# Patient Record
Sex: Male | Born: 1945
Health system: Southern US, Community
[De-identification: ages and names within clinical notes are randomized; demographics above are authoritative.]

## PROBLEM LIST (undated history)

## (undated) DIAGNOSIS — F32A Depression, unspecified: Secondary | ICD-10-CM

## (undated) DIAGNOSIS — I639 Cerebral infarction, unspecified: Secondary | ICD-10-CM

## (undated) DIAGNOSIS — H9190 Unspecified hearing loss, unspecified ear: Secondary | ICD-10-CM

## (undated) DIAGNOSIS — R413 Other amnesia: Secondary | ICD-10-CM

## (undated) DIAGNOSIS — F329 Major depressive disorder, single episode, unspecified: Secondary | ICD-10-CM

## (undated) DIAGNOSIS — R5383 Other fatigue: Secondary | ICD-10-CM

## (undated) DIAGNOSIS — R079 Chest pain, unspecified: Secondary | ICD-10-CM

## (undated) DIAGNOSIS — R480 Dyslexia and alexia: Secondary | ICD-10-CM

## (undated) DIAGNOSIS — F419 Anxiety disorder, unspecified: Secondary | ICD-10-CM

## (undated) DIAGNOSIS — J302 Other seasonal allergic rhinitis: Secondary | ICD-10-CM

## (undated) DIAGNOSIS — I10 Essential (primary) hypertension: Secondary | ICD-10-CM

## (undated) HISTORY — DX: Anxiety disorder, unspecified: F41.9

## (undated) HISTORY — DX: Major depressive disorder, single episode, unspecified: F32.9

## (undated) HISTORY — DX: Other amnesia: R41.3

## (undated) HISTORY — DX: Other fatigue: R53.83

## (undated) HISTORY — DX: Chest pain, unspecified: R07.9

## (undated) HISTORY — PX: TOTAL KNEE ARTHROPLASTY: SHX125

## (undated) HISTORY — DX: Essential (primary) hypertension: I10

## (undated) HISTORY — DX: Cerebral infarction, unspecified: I63.9

## (undated) HISTORY — PX: HIP ARTHROPLASTY: SHX981

## (undated) HISTORY — DX: Depression, unspecified: F32.A

## (undated) HISTORY — DX: Unspecified hearing loss, unspecified ear: H91.90

## (undated) HISTORY — DX: Other seasonal allergic rhinitis: J30.2

## (undated) HISTORY — DX: Dyslexia and alexia: R48.0

---

## 2003-06-25 ENCOUNTER — Ambulatory Visit (HOSPITAL_COMMUNITY): Admission: RE | Admit: 2003-06-25 | Discharge: 2003-06-25 | Payer: Self-pay | Admitting: Internal Medicine

## 2008-01-15 ENCOUNTER — Ambulatory Visit: Payer: Self-pay | Admitting: Cardiology

## 2008-01-15 ENCOUNTER — Encounter: Payer: Self-pay | Admitting: Cardiology

## 2008-01-16 ENCOUNTER — Observation Stay (HOSPITAL_COMMUNITY): Admission: AD | Admit: 2008-01-16 | Discharge: 2008-01-18 | Payer: Self-pay | Admitting: Cardiovascular Disease

## 2008-01-16 ENCOUNTER — Ambulatory Visit: Payer: Self-pay | Admitting: Cardiology

## 2008-01-16 ENCOUNTER — Encounter: Payer: Self-pay | Admitting: Cardiology

## 2008-01-17 ENCOUNTER — Encounter: Payer: Self-pay | Admitting: Cardiology

## 2008-02-18 ENCOUNTER — Ambulatory Visit: Payer: Self-pay | Admitting: Cardiology

## 2008-12-09 DIAGNOSIS — R079 Chest pain, unspecified: Secondary | ICD-10-CM

## 2008-12-09 DIAGNOSIS — I10 Essential (primary) hypertension: Secondary | ICD-10-CM | POA: Insufficient documentation

## 2008-12-10 ENCOUNTER — Encounter (INDEPENDENT_AMBULATORY_CARE_PROVIDER_SITE_OTHER): Payer: Self-pay | Admitting: *Deleted

## 2010-08-30 NOTE — Discharge Summary (Signed)
Chris [name], Chris [name]               ACCOUNT NO.:  [id number]   MEDICAL RECORD NO.:  [id number]          PATIENT TYPE:  INP   LOCATION:  2033                         FACILITY:  [facility]   PHYSICIAN:  Luis Abed, MD, FACCDATE OF BIRTH:  1946-01-31   DATE OF ADMISSION:  01/16/2008  DATE OF DISCHARGE:  01/18/2008                         DISCHARGE SUMMARY - REFERRING   DISCHARGING PHYSICIAN:  Luis Abed, MD.   DISCHARGE DIAGNOSES:  1. Weakness associated with atypical chest discomfort.  2. Hypertension.  3. History as noted below.   PROCEDURES PERFORMED:  Right and left cardiac catheterization on January 17, 2008 by Dr. Riley Kill.   SUMMARY OF HISTORY:  Chris [name] is a 65 year old white male who was  admitted to [facility] from his primary care physician's office  on January 15, 2008 secondary to weakness, diaphoresis,  lightheadedness associated with chest discomfort.  He ruled out for  myocardial infarction and cardiology was consulted, nonspecific ST/T  wave changes.   PAST MEDICAL HISTORY:  1. Notable for hypertension.  2. Dyslipidemia.  3. Negative stress Myoview in 2003.  4. Remote history of depression.  5. Alcohol use.   LABORATORY:  Admission weight was 84 kg.  Discharge H&H was 13.8 and  39.8, normal indices, platelets 159, WBCs 5.7, sodium 149, potassium  3.9, BUN 11, creatinine 0.88.   DIAGNOSTICS:  EKGs at [facility] showed sinus bradycardia with a rate of 49, T-  wave inversions in III and aVL, early repolarization, left axis  deviation, early R-wave.   HOSPITAL COURSE:  The patient was admitted to [facility] and  transferred to [facility] for further evaluation.  Right and left cardiac  catheterization was performed on January 17, 2008.  It showed normal  right heart pressures.  EF was 55%.  There was no significant  obstruction.  Post bedrest and ambulation, the patient was initially  unable to urinate.  Foley was temporarily placed and the  difficulty  resolved post discontinuation of the Foley.  He was ambulating in the  halls on January 18, 2008.  After review with Dr. Myrtis Ser, he felt that the  patient could be discharged home.   DISPOSITION:  Chris [name] is discharged home on January 18, 2008.   DISCHARGE INSTRUCTIONS:  Wound care and activities are specified in  regards to supplemental sheet postcatheterization.  He was specifically  advised that he should not be lifting, driving, sexual activity, camping  or back packing for at least 48 hours.  He was asked to maintain a blood  pressure diary and bring all medications and diary to all appointments.  The [facility] office will call him with an early follow up appointment per  Dr. Riley Kill with Dr. Andee Lineman.  He was also asked to her make arrangements  for follow up appointment with Dr. Dimas Aguas.   DISCHARGE MEDICATIONS:  He was discharged home on his previous  medications which include:  1. Aspirin 81 mg daily.  2. Lexapro 10 mg daily.   Discharge time 25 minutes.      Joellyn Rued, PA-C      Luis Abed, MD,  [facility]  Electronically Signed    EW/MEDQ  D:  01/18/2008  T:  01/18/2008  Job:  [id number]   cc:   Learta Codding, MD,FACC  Selinda Flavin

## 2010-08-30 NOTE — Cardiovascular Report (Signed)
NAMEMARTIN, BELLING               ACCOUNT NO.:  [id number]   MEDICAL RECORD NO.:  [id number]          PATIENT TYPE:  INP   LOCATION:  2033                         FACILITY:  [facility]   PHYSICIAN:  Arturo Morton. Riley Kill, MD, FACCDATE OF BIRTH:  09-12-1945   DATE OF PROCEDURE:  DATE OF DISCHARGE:                            CARDIAC CATHETERIZATION   INDICATIONS:  Mr. Geffre is a gentleman who presented with weakness,  diaphoresis, lightheadedness, and some chest discomfort.  He was ruled  out for myocardial infarction.  He underwent evaluation by Dr. Andee Lineman.  There was no exercise-induced chest pain or EKG change, but with  inferior perfusion defect.  There was also an echocardiogram, ejection  fraction of 55-60%.  There was mild dilatation of the left atrium, right  ventricle, and right atrium.  The right ventricular systolic pressure  was calculated at 46 mm.  He was brought to the Catheterization  Laboratory after evaluation of these findings.  I discussed the case  with Dr. Andee Lineman prior to the procedure.   PROCEDURES:  1. Left and right heart catheterization.  2. Selective coronary arteriography.  3. Selective left ventriculography.   DESCRIPTION OF PROCEDURE:  The patient was brought to the  Catheterization Laboratory and prepped and draped in the usual fashion.  Through the anterior puncture, the femoral artery was easily entered.  We used 5-French catheters.  We used an L3-L5 catheter to engage the  left coronary.  Intracoronary nitroglycerin was also given.  Given the  findings based on the echocardiographic study, I then in the light of  normal coronaries elected to recommend a right heart catheterization to  exclude to do pressures, and also to exclude a left-to-right shunt.  As  a result, we used a Smart needle to engage the right femoral vein.  A 7-  French sheath was placed.  A Swan-Ganz catheter was then placed into the  superior vena cava.  Saturations were obtained.  The  saturation was  obtained in the pulmonary artery and also in the aorta.  Right heart  pressures were sequentially performed and thermodilution cardiac outputs  were performed.  I then reviewed the findings with the patient,  subsequently with the family.  I notified Dr. Andee Lineman.  There were no  complications and he was taken to the holding area for sheath removal.   HEMODYNAMIC DATA.:  1. Aortic 128/76, mean 98.  2. Left ventricular 117/2.  3. Right atrium 4.  4. RV 23/3.  5. Pulmonary artery 17/5.  6. Pulmonary capillary wedge 7.  7. Aortic saturation 95%.  8. Superior vena cava saturation 64%.  9. PA saturation 69%.  10.Fick cardiac output 5.2 liters per minute.  11.Fick cardiac index 2.63 liters per minute per meter squared.  12.Thermodilution cardiac output 5.2 liters per minute.  13.Fick cardiac index 2.64 liters per minute per meters squared.   ANGIOGRAPHIC DATA.:  1. Ventriculography was done in the RAO projection.  Overall systolic      function was preserved.  No definite wall motion abnormalities were      seen.  There did not appear to be  significant mitral regurgitation.  2. The left main coronary was free of critical disease.  3. The LAD has just minor luminal irregularities, however, no areas of      high-grade obstruction were noted.  There were least 2 diagonal      branches of moderate size.  No critical obstruction was noted.  4. The circumflex demonstrates a large marginal branch, which goes out      posteriorly.  Multiple views were obtained to try to lay out the      overlapping takeoff of the AV circumflex in the proximal marginal.      After review of multiple views, there did not appear to be any      significant obstruction in this vessel.  At most, there was minimal      luminal irregularity at the bend and flexion point of the marginal.      However, there did not appear to be high-grade disease.  5. The right coronary artery is a large tortuous vessel,  there is PDA      and posterolateral branches.  No significant obstruction is noted.   CONCLUSION:  1. Well-preserved left ventricular function.  2. No evidence of high-grade coronary obstruction.  3. Normal right heart pressures without evidence of significant left-      to-right shunting.   DISPOSITION:  The patient will be taken to the floor and monitored.  His  enoxaparin will be discontinued.  He will likely need an outpatient  monitor.      Arturo Morton. Riley Kill, MD, [facility]  Electronically Signed     TDS/MEDQ  D:  01/17/2008  T:  01/18/2008  Job:  [id number]   cc:   Lorie Phenix, MD,FACC

## 2010-08-30 NOTE — Assessment & Plan Note (Signed)
[facility]                          [facility] OFFICE NOTE   NAME:[name], [name]                      MRN:          [id number]  DATE:02/18/2008                            DOB:          07-08-1945    PRIMARY CARE PHYSICIAN:  Dr. Selinda [name].   PRIMARY CARDIOLOGIST:  Chris Codding, MD, [facility]   REASON FOR VISIT:  Post cardiac catheterization followup.   HISTORY OF PRESENT ILLNESS:  I am seeing Chris [name] today for Dr.  Andee [name].  He is a pleasant 65 year old male referred recently to [facility] with symptoms of weakness, diaphoresis, and atypical chest  pain.  He ruled out for myocardial infarction and underwent a Cardiolite  study which demonstrated no electrocardiographic changes to suggest  ischemia, although a perfusion defect in the mid to basal inferior wall  that was reversible associated with ejection fraction of 50%.  Cardiac  catheterization was performed by Dr. Riley [name] on December 2, and this  revealed no significant obstructive coronary artery disease.  Left  ventricular ejection fraction was normal, as were right heart pressures  with no evidence of shunting noted.  The patient was observed in the  hospital and ultimately discharged on January 18, 2008, with scheduled  followup arranged today.  Chris [name] has a fairly detailed exercise  regiment constructed for him by a family member who is apparently  trained in exercise physiology.  He has been exercising approximately 5  days a week at the [facility] and seems to be tolerating this.  He does feel  somewhat depressed, but otherwise is not having any exertional chest  pain or limiting breathlessness.  His medications are outlined below.  Today, I spoke with him about his blood pressure which is elevated and  also reviewed his labs showing LDL control typically under 100 with no  specific medical therapy other than fish oil supplements.  We talked  about general risk factor  modification.   ALLERGIES:  No known drug allergies.   MEDICATIONS:  1. Aspirin 81 mg p.o. daily.  2. Lexapro 20 mg p.o. daily.  3. Naproxen 220 mg 2 tablets p.o. q.a.m.  4. Saw palmetto.  5. Multivitamin 1 p.o. daily.  6. Omega-3 supplements 900 mg p.o. daily.   REVIEW OF SYSTEMS:  As described in the history of present illness.  He  has had no difficulty with his right groin site status post  catheterization.   PHYSICAL EXAMINATION:  VITAL SIGNS:  Blood pressure is 144/86, heart  rate is 72, weight is 185 pounds.  GENERAL:  The patient is comfortable in no acute distress.  NECK:  No elevated jugular venous pressure.  No loud bruits.  LUNGS:  Clear without labored breathing at rest.  CARDIAC:  Regular rate and rhythm.  No S3 gallop or pericardial rub.  ABDOMEN:  The right groin site reveals no hematoma or bruit.  The site  is well healed.  EXTREMITIES:  Distal pulses are 2+.   IMPRESSION AND RECOMMENDATIONS:  Cardiac catheterization demonstrating  no significant obstructive coronary artery disease and normal right  heart pressures.  Left ventricular ejection fraction is also normal.  Based on this, we would recommend risk factor modification strategies  including maintenance of LDL cholesterol below 100 and also better blood  pressure control.  I suspect Chris [name] will ultimately require an  antihypertensive, and he will discuss this further when he sees Dr.  Dimas [name] back in the office in December.  Otherwise, we do not plan any  additional cardiac studies at this point.  We will plan to see him back  as needed.     Jonelle Sidle, MD  Electronically Signed    SGM/MedQ  DD: 02/18/2008  DT: 02/18/2008  Job #: [id number]   cc:   Chris Phenix, MD,FACC

## 2010-09-02 NOTE — Consult Note (Signed)
NAME:  Chris [name], Chris [name] NO.:  [id number]   MEDICAL RECORD NO.:  [id number]                  PATIENT TYPE:   LOCATION:                                       FACILITY:   PHYSICIAN:  R. Roetta Sessions, M.D.              DATE OF BIRTH:  10-23-1945   DATE OF CONSULTATION:  06/17/2003  DATE OF DISCHARGE:                                   CONSULTATION   REASON FOR CONSULTATION:  Intermittently hematochezia.   HISTORY OF PRESENT ILLNESS:  Chris [name] is a 65 year old, healthy,  Caucasian male who presents to our office with a couple year history of  intermittent hematochezia.  He does have history of hemorrhoids for many  years now.  Reportedly, he has intermittent small volume bright red rectal  bleeding which he sees on the toilet paper post defecation, typically once  per month.  He has never had screening colonoscopy.  He also reports recent  exacerbation of a hard hemorrhoid which he has noticed over the last 3-4  weeks.  He denies any proctalgia, pruritus or abdominal pain.  He denies any  problems with constipation or diarrhea.  His bowel movements are typically  soft and brown.  He has been taking Etodolac for the last two months as well  as aspirin 81 mg for the last two months for cardiac prevention as well.   PAST MEDICAL HISTORY:  1. Seasonal allergies.  2. Arthritis.  3. Sciatica.   PAST SURGICAL HISTORY:  Left knee arthroscopy in his 40's.   CURRENT MEDICATIONS:  1. Acacia 81 mg daily.  2. Equate Allergy 10 mg daily.  3. Etodolac 400 mg daily.  4. Glucosamine 1000 mg daily.  5. Multivitamin daily.  6. Vitamin C 500 mg daily.  7. Flexeril 10 mg p.r.n.   ALLERGIES:  No known drug allergies.   FAMILY HISTORY:  Chris [name] is unsure, his father is an alcoholic.  He  received a phone call one day from his father at age 39 stating that he had  colon cancer.  It was later followed by another conversation where his  father stated that  he was just kidding.  The patient is unsure at this  time, although, he did die shortly after this incident.  Mother deceased at  age 79 secondary to medication reaction.  He has two healthy brothers.   SOCIAL HISTORY:  Chris [name] has been married for 36 years and has two grown  healthy children.  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  He does report a remote less than 10 year history of tobacco use.  He does  report occasional alcohol use with 3-4 glasses of wine on occasional  weekend.  He denies any drug use.   REVIEW OF SYSTEMS:  CONSTITUTIONAL:  Weight is stable.  Appetite is good.  Denies any fatigue.  CARDIOVASCULAR:  Denies any chest pain  or palpitations.  PULMONARY:  Denies any shortness of breath, dyspnea or hemoptysis.  HEENT:  He does complain of frequent sinus problems along the lines of allergic  rhinitis.  HEME:  Denies any blood dyscrasias or anemias.  Denies any easy  bruising.  GI:  See HPI.  Also, denies any heartburn, dyspepsia, dysphagia  or odynophagia.   PHYSICAL EXAMINATION:  VITAL SIGNS:  Weight 183.75 pounds, height 68 inches,  temperature 98.3, blood pressure 120/80, pulse 78.  GENERAL APPEARANCE:  Chris [name] is a 65 year old well-developed, well-  nourished Caucasian male in no acute distress.  He is alert, oriented,  pleasant and cooperative.  HEENT:  Sclerae are clear, nonicteric conjunctivae.  Oropharynx pink and  moist without any lesion.  NECK:  Supple without any mass or thyromegaly.  CHEST:  Heart regular rate and rhythm with normal S1, S2 with no murmurs,  clicks, rubs or gallops.  LUNGS:  Clear to auscultation bilaterally.  ABDOMEN:  Flat with positive bowel sounds x4.  Soft, nontender, nondistended  with no palpable mass or hepatosplenomegaly.  No rebound tenderness.  EXTREMITIES:  Good color, no edema.  RECTAL:  A less than 1 cm raised, palpable, flesh colored lesion that is  nontender without any active bleeding.  No  surrounding erythema.  Good  sphincter tone.  Hemoccult stool is negative and light brown.   ASSESSMENT:  Chris [name] is a 65 year old Caucasian male with intermittent  hematochezia as well as history of hemorrhoids.  Chris [name]'s bleeding  definitely may be related to exacerbation of benign anorectal source or  hemorrhoids.  However, given his age of 65 years old and possible family  history, would definitely recommend further evaluation to rule out polyps or  colorectal carcinoma.  Will treat him for hemorrhoids symptomatically,  although, perirectal lesion does not appear to be active hemorrhoid at this  time.  It is firm and should continue to be watched and may need further  evaluation.   RECOMMENDATIONS:  1. Prescription was given for ProctoFoam HC to use up to q.i.d.  2. Recommended Colace stool softeners to prevent straining.  3. Will schedule colonoscopy with Dr. Jena [name] in the near future.  I have     discussed this procedure including risks and benefits which include but     are not limited to bleeding, perforation and infection.  He agrees with     this plan.  He is to hold his aspirin for 3 days prior to the procedure.   We would like to thank Dr. Dimas Aguas for allowing [place] to participate in the care  of Chris [name].     ________________________________________  ___________________________________________  Nicholas Lose, N.P.                  Jonathon Bellows, M.D.   KC/MEDQ  D:  06/17/2003  T:  06/17/2003  Job:  [id number]   cc:   Selinda Flavin  [address]  [place]  [place] [address]  Fax: [phone]

## 2010-09-02 NOTE — Op Note (Signed)
NAME:  Chris [name], CAPTAIN                         ACCOUNT NO.:  [id number]   MEDICAL RECORD NO.:  [id number]                   PATIENT TYPE:  AMB   LOCATION:  DAY                                  FACILITY:  [facility]   PHYSICIAN:  R. Roetta Sessions, M.D.              DATE OF BIRTH:  1945/10/19   DATE OF PROCEDURE:  06/25/2003  DATE OF DISCHARGE:                                 OPERATIVE REPORT   PROCEDURE:  Colonoscopy with snare polypectomy.   INDICATIONS FOR PROCEDURE:  The patient is a 65 year old gentleman who comes  for colonoscopy. He has never had his lower GI tract evaluated, he has rare  occasional (i.e. a couple times yearly) small volume painless hematochezia.  There is no family history of colorectal neoplasia for sure although there  is a question about his father's medical history. Colonoscopy is now being  done. This approach has been discussed with the patient at length. The  potential risks, benefits, and alternatives have been reviewed, questions  answered and he is agreeable. Please see my June 17, 2003 consultation note.   MONITORING:  O2 saturation, blood pressure, pulse and respirations were  monitored throughout the entire procedure.  At the patient's request, he was  given no IV conscious sedation.   INSTRUMENT:  Olympus pediatric colonoscope.   FINDINGS:  Digital exam revealed anal papilla otherwise negative.   ENDOSCOPIC FINDINGS:  The prep was good.   RECTUM:  Examination of the rectal mucosa including retroflexed view of the  anal verge revealed anal papilla and internal hemorrhoids otherwise normal  rectal mucosa.   COLON:  The colonic mucosa was surveyed from the rectosigmoid junction  through the left transverse right colon to the area of the appendiceal  orifice, ileocecal valve and cecum. These structures were seen and  photographed for the record. From this level, the scope was slowly  withdrawn.  All previously mentioned mucosal surfaces were again  seen.  The  patient was noted to have left sided transverse diverticula.  There was a  0.75 cm polyp on the stalk at 35 cm which was removed with snare cautery.  The patient tolerated the procedure extremely well with no conscious  sedation and was discharged.   IMPRESSION:  Single anal papilla and internal hemorrhoids otherwise normal  rectum.  Left sided transverse diverticula, polyp at 35 cm resected with the  snare.  The remainder of the colonic mucosa appeared normal.  I suspect the  patient's bled intermittently from hemorrhoids.   RECOMMENDATIONS:  1. Hemorrhoid and diverticulosis literature given to Mr. Christell Faith.  2. He should bolster his fiber intake, consider taking Metamucil or Citrucel     daily.  No aspirin or arthritis     medications for 10 days.  3. Followup on path.  4. Further recommendations to follow.      ___________________________________________  Jonathon Bellows, M.D.   RMR/MEDQ  D:  06/25/2003  T:  06/25/2003  Job:  [id number]   cc:   Selinda Flavin  [address]  [place]  [place] [address]  Fax: [phone]

## 2010-10-13 ENCOUNTER — Encounter: Payer: Self-pay | Admitting: Cardiology

## 2011-01-17 LAB — BASIC METABOLIC PANEL
BUN: 15
CO2: 26
Calcium: 8.6
Calcium: 8.8
Creatinine, Ser: 0.88
Creatinine, Ser: 1
GFR calc Af Amer: >60
GFR calc non Af Amer: >60
Glucose, Bld: 92
Glucose, Bld: 93

## 2011-01-17 LAB — POCT I-STAT 3, ART BLOOD GAS (G3+): Bicarbonate: 24.1 — ABNORMAL HIGH

## 2011-01-17 LAB — POCT I-STAT 3, VENOUS BLOOD GAS (G3P V)
TCO2: 26
TCO2: 26
pH, Ven: 7.36 — ABNORMAL HIGH
pH, Ven: 7.433 — ABNORMAL HIGH

## 2011-01-17 LAB — CBC
HCT: 39.8
HCT: 40.5
Hemoglobin: 13.8
Hemoglobin: 14.3
MCHC: 34.6
MCHC: 35.2
MCV: 92.4
MCV: 92.6
Platelets: 159
Platelets: 172
RBC: 4.3
RBC: 4.38
RDW: 12.7
RDW: 12.8
WBC: 5.5
WBC: 5.7

## 2011-01-17 LAB — PROTIME-INR
INR: 1
Prothrombin Time: 12.8

## 2011-01-17 LAB — D-DIMER, QUANTITATIVE: D-Dimer, Quant: 0.45

## 2011-01-17 LAB — APTT: aPTT: 30

## 2011-06-20 ENCOUNTER — Ambulatory Visit (INDEPENDENT_AMBULATORY_CARE_PROVIDER_SITE_OTHER): Payer: Medicare Other | Admitting: Infectious Disease

## 2011-06-20 ENCOUNTER — Encounter: Payer: Self-pay | Admitting: Infectious Disease

## 2011-06-20 DIAGNOSIS — R413 Other amnesia: Secondary | ICD-10-CM

## 2011-06-20 DIAGNOSIS — R5383 Other fatigue: Secondary | ICD-10-CM | POA: Insufficient documentation

## 2011-06-20 DIAGNOSIS — F329 Major depressive disorder, single episode, unspecified: Secondary | ICD-10-CM | POA: Insufficient documentation

## 2011-06-20 DIAGNOSIS — B89 Unspecified parasitic disease: Secondary | ICD-10-CM

## 2011-06-20 DIAGNOSIS — F419 Anxiety disorder, unspecified: Secondary | ICD-10-CM | POA: Insufficient documentation

## 2011-06-20 DIAGNOSIS — R480 Dyslexia and alexia: Secondary | ICD-10-CM | POA: Insufficient documentation

## 2011-06-20 DIAGNOSIS — B998 Other infectious disease: Secondary | ICD-10-CM

## 2011-06-20 DIAGNOSIS — J302 Other seasonal allergic rhinitis: Secondary | ICD-10-CM | POA: Insufficient documentation

## 2011-06-20 DIAGNOSIS — R5381 Other malaise: Secondary | ICD-10-CM

## 2011-06-20 NOTE — Assessment & Plan Note (Signed)
There is nothing to suggest his memory problems or any way related to his animal exposure. He certainly may have symptoms of anxiety after this exposure but I see no evidence of progressive cognitive deficits. His cognitive issues can be worked up by his primary care physician.

## 2011-06-20 NOTE — Assessment & Plan Note (Signed)
Again appear related to depression and anxiety.

## 2011-06-20 NOTE — Progress Notes (Signed)
Subjective:    Patient ID: Chris [name], male    DOB: Sep 12, 1945, 66 y.o.   MRN: [id number]  HPI  66 year old Caucasian male with past medical history significant for depression and seasonal allergies and dyslexia presents to our infectious disease clinic for referral after exposure to a wild animal. Patient is a occasional Therapist, nutritional. He also has a habit of eating meat to crows in his backyard and has been doing this for several months. In early February he encountered a groundhog which had been struck by a vehicle and was deceased on the side of the road. He however the groundhog which he says was still warm but was diseased. He took the [place] home and skin is fine legs there. He then said the wrong knee to the crows in the back of his yard. After doing so he began to feel anxious and concerned that he might have contracted an infectious disease and was concerned he might pass it on to his relatives and grandchildren. He saw his primary care physician Dr. Selinda Flavin on February 26 having noted increased anxiety and worsening memory. Apparently the state health Department have been called and had recommended the patient be given rabies vaccination. I was called by the infectious disease consultant for [facility]  health. I felt based on the exposure history there was ABSOLUTELY no risk whatsoever for RABIES TO the patient AND NO INDICATION FOR   rabies post exposure prophylaxis . I was concerned however by his exposure to a wild animal which is skinned and in particular with  concern that he could potentially contract to tularmeia. Therefore advise his primary care physician to examine the patient closely consider checking a chest x-ray and consider serologies and blood cultures for Francisella tularemia and should the patient appears sick and to consider empiric antibiotics. The patient's primary care physician obtained a basic labs and CBC and metabolic panel normal chest x-ray was completely normal. Patient  was then referred to [place] in the infectious disease clinic. Since being seen in his doctor's office he had done quite well he has been without cough without fever without lymphadenopathy without sore throat without chills without malaise. He feels most of his symptoms were more likely related to anxiety and he feels better. Does still consist continue suffered from some problems with these appear chronic in nature. He absolutely no indication for further workup for tolerating her are other zoonosis  Review of Systems  Constitutional: Negative for fever, chills, diaphoresis, activity change, appetite change, fatigue and unexpected weight change.  HENT: Negative for congestion, sore throat, rhinorrhea, sneezing, trouble swallowing and sinus pressure.   Eyes: Negative for photophobia and visual disturbance.  Respiratory: Negative for cough, chest tightness, shortness of breath, wheezing and stridor.   Cardiovascular: Negative for chest pain, palpitations and leg swelling.  Gastrointestinal: Negative for nausea, vomiting, abdominal pain, diarrhea, constipation, blood in stool, abdominal distention and anal bleeding.  Genitourinary: Negative for dysuria, hematuria, flank pain and difficulty urinating.  Musculoskeletal: Negative for myalgias, back pain, joint swelling, arthralgias and gait problem.  Skin: Negative for color change, pallor, rash and wound.  Neurological: Negative for dizziness, tremors, weakness and light-headedness.  Hematological: Negative for adenopathy. Does not bruise/bleed easily.  Psychiatric/Behavioral: Negative for behavioral problems, confusion, sleep disturbance, dysphoric mood, decreased concentration and agitation.       Objective:   Physical Exam  Constitutional: He is oriented to person, place, and time. He appears well-developed and well-nourished. No distress.  HENT:  Head: Normocephalic  and atraumatic.  Mouth/Throat: Oropharynx is clear and moist. No oropharyngeal  exudate.  Eyes: Conjunctivae and EOM are normal. Pupils are equal, round, and reactive to light. No scleral icterus.  Neck: Normal range of motion. Neck supple. No JVD present.  Cardiovascular: Normal rate, regular rhythm and normal heart sounds.  Exam reveals no gallop and no friction rub.   No murmur heard. Pulmonary/Chest: Effort normal and breath sounds normal. No respiratory distress. He has no wheezes. He has no rales. He exhibits no tenderness.  Abdominal: He exhibits no distension and no mass. There is no tenderness. There is no rebound and no guarding.  Musculoskeletal: He exhibits no edema and no tenderness.  Lymphadenopathy:    He has no cervical adenopathy.  Neurological: He is alert and oriented to person, place, and time. He has normal reflexes. He exhibits normal muscle tone. Coordination normal.  Skin: Skin is warm and dry. He is not diaphoretic. No erythema. No pallor.  Psychiatric: He has a normal mood and affect. His behavior is normal. Judgment and thought content normal.          Assessment & Plan:  Animal transmitted disease There is absolutely no risk for rabies with this patient's exposure. He did not cut open the skull the animal or handle his brain. He was not bitten by the animal was alive. Therefore there is no risk for rabies from this exposure to this groundhog. The patient's scanning of this wild animal did put him at risk for tularemia before she has actually no evidence of infection with this organism or other zoonosis. Recommend any further laboratory workup the patient I see no evidence of infectious diseases in this patient  Memory problem There is nothing to suggest his memory problems or any way related to his animal exposure. He certainly may have symptoms of anxiety after this exposure but I see no evidence of progressive cognitive deficits. His cognitive issues can be worked up by his primary care physician.  Fatigue Again appear related to  depression and anxiety.

## 2011-06-20 NOTE — Assessment & Plan Note (Signed)
There is absolutely no risk for rabies with this patient's exposure. He did not cut open the skull the animal or handle his brain. He was not bitten by the animal was alive. Therefore there is no risk for rabies from this exposure to this groundhog. The patient's scanning of this wild animal did put him at risk for tularemia before she has actually no evidence of infection with this organism or other zoonosis. Recommend any further laboratory workup the patient I see no evidence of infectious diseases in this patient

## 2014-11-11 DIAGNOSIS — F332 Major depressive disorder, recurrent severe without psychotic features: Secondary | ICD-10-CM | POA: Diagnosis not present

## 2014-12-03 DIAGNOSIS — J019 Acute sinusitis, unspecified: Secondary | ICD-10-CM | POA: Diagnosis not present

## 2014-12-07 DIAGNOSIS — F328 Other depressive episodes: Secondary | ICD-10-CM | POA: Diagnosis not present

## 2014-12-07 DIAGNOSIS — R42 Dizziness and giddiness: Secondary | ICD-10-CM | POA: Diagnosis not present

## 2014-12-07 DIAGNOSIS — H9313 Tinnitus, bilateral: Secondary | ICD-10-CM | POA: Diagnosis not present

## 2014-12-07 DIAGNOSIS — J302 Other seasonal allergic rhinitis: Secondary | ICD-10-CM | POA: Diagnosis not present

## 2014-12-07 DIAGNOSIS — F419 Anxiety disorder, unspecified: Secondary | ICD-10-CM | POA: Diagnosis not present

## 2014-12-14 DIAGNOSIS — T2612XA Burn of cornea and conjunctival sac, left eye, initial encounter: Secondary | ICD-10-CM | POA: Diagnosis not present

## 2014-12-17 DIAGNOSIS — T2612XA Burn of cornea and conjunctival sac, left eye, initial encounter: Secondary | ICD-10-CM | POA: Diagnosis not present

## 2014-12-18 DIAGNOSIS — R972 Elevated prostate specific antigen [PSA]: Secondary | ICD-10-CM | POA: Diagnosis not present

## 2014-12-24 DIAGNOSIS — T2612XA Burn of cornea and conjunctival sac, left eye, initial encounter: Secondary | ICD-10-CM | POA: Diagnosis not present

## 2014-12-25 DIAGNOSIS — R3919 Other difficulties with micturition: Secondary | ICD-10-CM | POA: Diagnosis not present

## 2014-12-25 DIAGNOSIS — R972 Elevated prostate specific antigen [PSA]: Secondary | ICD-10-CM | POA: Diagnosis not present

## 2015-01-13 DIAGNOSIS — F332 Major depressive disorder, recurrent severe without psychotic features: Secondary | ICD-10-CM | POA: Diagnosis not present

## 2015-03-17 DIAGNOSIS — F332 Major depressive disorder, recurrent severe without psychotic features: Secondary | ICD-10-CM | POA: Diagnosis not present

## 2015-04-05 DIAGNOSIS — H9313 Tinnitus, bilateral: Secondary | ICD-10-CM | POA: Diagnosis not present

## 2015-04-05 DIAGNOSIS — I10 Essential (primary) hypertension: Secondary | ICD-10-CM | POA: Diagnosis not present

## 2016-11-13 ENCOUNTER — Other Ambulatory Visit: Payer: Self-pay | Admitting: Specialist

## 2016-11-13 DIAGNOSIS — R519 Headache, unspecified: Secondary | ICD-10-CM

## 2016-11-13 DIAGNOSIS — R51 Headache: Principal | ICD-10-CM

## 2016-11-27 ENCOUNTER — Ambulatory Visit
Admission: RE | Admit: 2016-11-27 | Discharge: 2016-11-27 | Disposition: A | Discharge status: Home / Self Care | Payer: Medicare Other | Source: Ambulatory Visit | Attending: Specialist | Admitting: Specialist

## 2016-11-27 DIAGNOSIS — R51 Headache: Principal | ICD-10-CM

## 2016-11-27 DIAGNOSIS — R519 Headache, unspecified: Secondary | ICD-10-CM

## 2018-03-12 ENCOUNTER — Encounter: Payer: Self-pay | Admitting: Neurology

## 2018-05-30 ENCOUNTER — Other Ambulatory Visit (INDEPENDENT_AMBULATORY_CARE_PROVIDER_SITE_OTHER): Payer: Medicare Other

## 2018-05-30 ENCOUNTER — Encounter: Payer: Self-pay | Admitting: Neurology

## 2018-05-30 ENCOUNTER — Ambulatory Visit: Payer: Medicare Other | Admitting: Neurology

## 2018-05-30 ENCOUNTER — Other Ambulatory Visit: Payer: Self-pay

## 2018-05-30 VITALS — BP 142/90 | HR 83 | Ht 67.0 in | Wt 180.0 lb

## 2018-05-30 DIAGNOSIS — Q283 Other malformations of cerebral vessels: Secondary | ICD-10-CM

## 2018-05-30 DIAGNOSIS — R413 Other amnesia: Secondary | ICD-10-CM

## 2018-05-30 DIAGNOSIS — G3184 Mild cognitive impairment, so stated: Secondary | ICD-10-CM | POA: Diagnosis not present

## 2018-05-30 MED ORDER — DONEPEZIL HCL 10 MG PO TABS: ORAL_TABLET | ORAL | 11 refills | Status: DC

## 2018-05-30 NOTE — Progress Notes (Signed)
NEUROLOGY CONSULTATION NOTE  Chris [name]. MRN: [id number] DOB: 1946/02/17  Referring provider: Dr. Terrilyn Saver Primary care provider: Dr. Terrilyn Saver  Reason for consult:  Memory loss  Dear Dr Reece Levy:  Thank you for your kind referral of Chris [name]. for consultation of the above symptoms. Although his history is well known to you, please allow me to reiterate it for the purpose of our medical record. The patient was accompanied to the clinic by his wife who also provides collateral information. Records and images were personally reviewed where available.  HISTORY OF PRESENT ILLNESS: This is a 73 year old right-handed man with a history of diet-controlled hypertension, depression, anxiety, presenting for evaluation of worsening memory. He states his memory is good and bad at certain things, she cannot recall what he did yesterday, he would not recall the date and have to look at his calendar. He denies getting lost driving. His wife occasionally reminds him to take his medications. He has frequent headaches and wants to take over the counter medication all day long. His wife manages bills. His wife states he has never been one to remember names, but in the past 6 months, she has noticed he would have difficulty thinking of the name of an object and get dates and times confused. He would ask her 5 times in one day what time his doctor appointment was. She denies any significant driving concerns. She has noticed that he gets really nervous and shaky around 2pm, then calms down by 8pm. Sleep is good, no wandering behavior. He still feels drowsy during the day. No paranoia or hallucinations. They report he had failed several grades in school, joined the [employer], then went to [employer] where he had a GPA of 3.85. It was in college where he was formally diagnosed with dyslexia. 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 There is no family history of dementia.  No history of significant head injuries. He drinks 1 big can of beer a week, sometimes he drinks 3-4 times a week.   He has had daily headaches for several years and saw neurologist Dr. Domingo Cocking in 2018. He had trigger point injections and became headache-free for a while, until he had a fall with loss of consciousness last September 2019 and headaches recurred. He has a moderate ache in the frontal region constantly, no nausea/vomiting, photo/phonophobia. He has been taking Tylenol and BC powders but states he does not take it quite everyday. He has dizziness with lightheadedness sometimes lasting all day until he takes meclizine which helps. He denies any diplopia, dysarthria/dysphagia, back pain, bladder dysfunction, anosmia. He has occasional neck pain and constipation. He started noticing bilateral hand tremors 2-3 years ago which do not affect writing or using utensils. He fell while walking on a trail last September, he recalls starting feeling bad in his stomach and started walking faster, then passed out. He feels he was only out for a second, he does not remember hitting the ground but stood up and and realized he had fallen down. He had significant injury on the left eyelid and left side of his face and underwent plastic surgery. He still has stitches on the left lateral eyelid. He has occasional numbness and tingling in his right hand, it feels cold sometimes. He has noticed difficulty buttoning or tying shoelaces with his right hand. His wife has noticed shuffling when walking over the past year.  I personally reviewed MRI brain without contrast  done 11/27/2016 which did not show any acute changes. There was scattered foci of susceptibility are present over the posterior left temporal lobe and occipital lobe, increased dural vasculature is suggested on axial image 8 of series 7. 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 There was moderate diffuse atrophy and mild chronic microvascular  disease.   PAST MEDICAL HISTORY: Past Medical History:  Diagnosis Date  . Anxiety   . Chest pain, unspecified   . Depression   . Dyslexia   . Fatigue   . Memory problem   . Seasonal allergies   . Unspecified essential hypertension     PAST SURGICAL HISTORY: Past Surgical History:  Procedure Laterality Date  . HIP ARTHROPLASTY    . TOTAL KNEE ARTHROPLASTY      MEDICATIONS: Current Outpatient Medications on File Prior to Visit  Medication Sig Dispense Refill  . clonazePAM (KLONOPIN) 1 MG tablet Take 1 mg by mouth 2 (two) times daily.    . DULoxetine (CYMBALTA) 30 MG capsule TAKE ONE CAPSULE BY MOUTH IN THE MORNING WITHOUT FOOD    . Meclizine HCl 25 MG CHEW Chew by mouth.    . Multiple Vitamins-Minerals (MULTIVITAL) tablet Take 1 tablet by mouth daily.      . Omega-3 Fatty Acids (FISH OIL) 1000 MG CAPS Take by mouth daily.      . polyvinyl alcohol (LIQUIFILM TEARS) 1.4 % ophthalmic solution 1 drop as needed.     No current facility-administered medications on file prior to visit.     ALLERGIES: No Known Allergies  FAMILY HISTORY: History reviewed. No pertinent family history.  SOCIAL HISTORY: Social History   Socioeconomic History  . Marital status: Single    Spouse name: Not on file  . Number of children: Not on file  . Years of education: Not on file  . Highest education level: Not on file  Occupational History  . Not on file  Social Needs  . Financial resource strain: Not on file  . Food insecurity:    Worry: Not on file    Inability: Not on file  . Transportation needs:    Medical: Not on file    Non-medical: Not on file  Tobacco Use  . Smoking status: Former Smoker  Substance and Sexual Activity  . Alcohol use: No  . Drug use: No  . Sexual activity: Yes  Lifestyle  . Physical activity:    Days per week: Not on file    Minutes per session: Not on file  . Stress: Not on file  Relationships  . Social connections:    Talks on phone: Not on file     Gets together: Not on file    Attends religious service: Not on file    Active member of club or organization: Not on file    Attends meetings of clubs or organizations: Not on file    Relationship status: Not on file  . Intimate partner violence:    Fear of current or ex partner: Not on file    Emotionally abused: Not on file    Physically abused: Not on file    Forced sexual activity: Not on file  Other Topics Concern  . Not on file  Social History Narrative   Retired. Regularly exercises.     REVIEW OF SYSTEMS: Constitutional: No fevers, chills, or sweats, no generalized fatigue, change in appetite Eyes: No visual changes, double vision, eye pain Ear, nose and throat: No hearing loss, ear pain, nasal congestion,  sore throat Cardiovascular: No chest pain, palpitations Respiratory:  No shortness of breath at rest or with exertion, wheezes GastrointestinaI: No nausea, vomiting, diarrhea, abdominal pain, fecal incontinence Genitourinary:  No dysuria, urinary retention or frequency Musculoskeletal:  No neck pain, back pain Integumentary: No rash, pruritus, skin lesions Neurological: as above Psychiatric: No depression, insomnia, anxiety Endocrine: No palpitations, fatigue, diaphoresis, mood swings, change in appetite, change in weight, increased thirst Hematologic/Lymphatic:  No anemia, purpura, petechiae. Allergic/Immunologic: no itchy/runny eyes, nasal congestion, recent allergic reactions, rashes  PHYSICAL EXAM: Vitals:   05/30/18 0900  BP: (!) 142/90  Pulse: 83  SpO2: 94%   General: No acute distress Head:  Normocephalic/atraumatic Eyes: Fundoscopic exam shows bilateral sharp discs, no vessel changes, exudates, or hemorrhages Neck: supple, no paraspinal tenderness, full range of motion Back: No paraspinal tenderness Heart: regular rate and rhythm Lungs: Clear to auscultation bilaterally. Vascular: No carotid bruits. Skin/Extremities: No rash, no  edema Neurological Exam: Mental status: alert and oriented to person, place, and time, no dysarthria or aphasia, Fund of knowledge is appropriate.  Recent and remote memory are impaired.  Attention and concentration are normal.    Able to name objects and repeat phrases.  Montreal Cognitive Assessment  05/30/2018  Visuospatial/ Executive (0/5) 5  Naming (0/3) 1  Attention: Read list of digits (0/2) 1  Attention: Read list of letters (0/1) 1  Attention: Serial 7 subtraction starting at 100 (0/3) 2  Language: Repeat phrase (0/2) 0  Language : Fluency (0/1) 1  Abstraction (0/2) 0  Delayed Recall (0/5) 0  Orientation (0/6) 4  Total 15   Cranial nerves: CN I: not tested CN II: pupils equal, round and reactive to light, visual fields intact, fundi unremarkable. CN III, IV, VI:  full range of motion, no nystagmus, no ptosis CN V: facial sensation intact CN VII: upper and lower face symmetric CN VIII: hearing intact to finger rub CN IX, X: gag intact, uvula midline CN XI: sternocleidomastoid and trapezius muscles intact CN XII: tongue midline Bulk & Tone: normal, no cogwheeling, no fasciculations. Motor: 5/5 throughout with no pronator drift. Sensation: intact to light touch, cold, pin, vibration and joint position sense.  No extinction to double simultaneous stimulation.  Romberg test negative Deep Tendon Reflexes: +2 throughout, no ankle clonus Plantar responses: downgoing bilaterally Cerebellar: no incoordination on finger to nose, heel to shin. No dysdiadochokinesia Gait: slow and cautious, unable to tandem walk. Negative pull test. Tremor: none in office today +Tinel sign at the right elbow and wrist  IMPRESSION: This is a 73 year old right-handed man with a history of diet-controlled hypertension, depression, anxiety, presenting for evaluation of worsening memory. His neurological exam is largely non-focal, he does have signs of right carpal tunnel syndrome, no signs of  parkinsonism. MOCA today 15/30, symptoms suggestive of Mild Cognitive Impairment. We discussed different causes of memory loss. Check TSH and B12. MRI brain with and without contrast and MRA head without contrast will be ordered to assess for underlying structural abnormality. 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 We discussed medications such as Donepezil, including expectations and side effects, he would like to discuss with Dr. Reece Levy starting Donepezil 5mg  daily for 2 weeks, then increase to 10mg  daily.  We discussed how mood can also contribute to memory issues, continue follow-up with Behavioral Health. We discussed the importance of control of  vascular risk factors, physical exercise, and brain stimulation exercises for brain health. Follow-up in 6 months, they know to call for any changes.   Thank you for allowing me to participate in the care of this patient. Please do not hesitate to call for any questions or concerns.   Ellouise Newer, M.D.  CC: Dr. Reece Levy, Dr. Nadara Mustard

## 2018-05-30 NOTE — Patient Instructions (Addendum)
1. Bloodwork for TSH, B12  Your provider requests that you have LABS drawn today.  We share a lab with [facility] - they are located in [address] (second floor) of this building.  Once you get there, please have a seat and the phlebotomist will call your name.  If you have waited more than 15 minutes, please advise the front desk   2. Schedule MRI brain with and without contrast, MRA head without contrast  We have sent a referral to [facility] for your MRI/MRA and they will call you directly to schedule your appt. They are located at [place]. If you need to contact them directly please call [phone].   3. The medication to help slow down worsening memory is called Donepezil, a prescription for Donepezil 10mg : Take 1/2 tablet daily for 2 weeks, then increase to 1 tablet daily has been sent to your pharmacy  4. Continue follow-up with Dr. Reece Levy for depression and anxiety, which can also contribute to memory issues  5. Follow-up in 6 months, call for any changes   RECOMMENDATIONS FOR ALL PATIENTS WITH MEMORY PROBLEMS: 1. Continue to exercise (Recommend 30 minutes of walking everyday, or 3 hours every week) 2. Increase social interactions - continue going to [place] and enjoy social gatherings with friends and family 3. Eat healthy, avoid fried foods and eat more fruits and vegetables 4. Maintain adequate blood pressure, blood sugar, and blood cholesterol level. Reducing the risk of stroke and cardiovascular disease also helps promoting better memory. 5. Avoid stressful situations. Live a simple life and avoid aggravations. Organize your time and prepare for the next day in anticipation. 6. Sleep well, avoid any interruptions of sleep and avoid any distractions in the bedroom that may interfere with adequate sleep quality 7. Avoid sugar, avoid sweets as there is a strong link between excessive sugar intake, diabetes, and cognitive impairment We discussed the  Mediterranean diet, which has been shown to help patients reduce the risk of progressive memory disorders and reduces cardiovascular risk. This includes eating fish, eat fruits and green leafy vegetables, nuts like almonds and hazelnuts, walnuts, and also use olive oil. Avoid fast foods and fried foods as much as possible. Avoid sweets and sugar as sugar use has been linked to worsening of memory function.

## 2018-05-31 LAB — TSH: TSH: 1.9 m[IU]/L (ref 0.40–4.50)

## 2018-05-31 LAB — VITAMIN B12: Vitamin B-12: 329 pg/mL (ref 200–1100)

## 2018-06-07 ENCOUNTER — Telehealth: Payer: Self-pay

## 2018-06-07 NOTE — Telephone Encounter (Signed)
LMOM for pt's wife, Pam, relaying message below.

## 2018-06-07 NOTE — Telephone Encounter (Signed)
-----   Message from Cameron Sprang, MD sent at 05/31/2018 12:19 PM EST ----- Pls let patient/wife know the thyroid was normal, B12 level was low normal 329, we usually want the level to be above 400 when people have memory issues. Recommend starting B12 supplements 546mcg daily. Thanks

## 2018-06-15 ENCOUNTER — Ambulatory Visit
Admission: RE | Admit: 2018-06-15 | Discharge: 2018-06-15 | Disposition: A | Discharge status: Home / Self Care | Payer: Medicare Other | Source: Ambulatory Visit | Attending: Neurology | Admitting: Neurology

## 2018-06-15 DIAGNOSIS — Q283 Other malformations of cerebral vessels: Secondary | ICD-10-CM

## 2018-06-15 DIAGNOSIS — R413 Other amnesia: Secondary | ICD-10-CM

## 2018-06-15 MED ORDER — GADOBENATE DIMEGLUMINE 529 MG/ML IV SOLN
17.0000 mL | Freq: Once | INTRAVENOUS | Status: AC | PRN
  Administered 2018-06-15: 17 mL via INTRAVENOUS

## 2018-07-04 ENCOUNTER — Ambulatory Visit: Payer: Medicare Other | Admitting: Neurology

## 2018-12-04 ENCOUNTER — Encounter: Payer: Self-pay | Admitting: Neurology

## 2018-12-04 ENCOUNTER — Telehealth (HOSPITAL_COMMUNITY): Payer: Self-pay | Admitting: Psychiatry

## 2018-12-04 ENCOUNTER — Ambulatory Visit (INDEPENDENT_AMBULATORY_CARE_PROVIDER_SITE_OTHER): Payer: Medicare Other | Admitting: Neurology

## 2018-12-04 ENCOUNTER — Other Ambulatory Visit: Payer: Self-pay

## 2018-12-04 VITALS — BP 151/95 | HR 82 | Ht 67.0 in | Wt 175.1 lb

## 2018-12-04 DIAGNOSIS — F03A0 Unspecified dementia, mild, without behavioral disturbance, psychotic disturbance, mood disturbance, and anxiety: Secondary | ICD-10-CM

## 2018-12-04 DIAGNOSIS — F0390 Unspecified dementia without behavioral disturbance: Secondary | ICD-10-CM

## 2018-12-04 MED ORDER — DONEPEZIL HCL 10 MG PO TABS: ORAL_TABLET | ORAL | 3 refills | Status: DC

## 2018-12-04 NOTE — Patient Instructions (Signed)
1. Schedule Neurocognitive testing  2. Continue Donepezil 10mg  daily  3. Follow-up in 6 months, call for any changes  FALL PRECAUTIONS: Be cautious when walking. Scan the area for obstacles that may increase the risk of trips and falls. When getting up in the mornings, sit up at the edge of the bed for a few minutes before getting out of bed. Consider elevating the bed at the head end to avoid drop of blood pressure when getting up. Walk always in a well-lit room (use night lights in the walls). Avoid area rugs or power cords from appliances in the middle of the walkways. Use a walker or a cane if necessary and consider physical therapy for balance exercise. Get your eyesight checked regularly.  FINANCIAL OVERSIGHT: Supervision, especially oversight when making financial decisions or transactions is also recommended.  HOME SAFETY: Consider the safety of the kitchen when operating appliances like stoves, microwave oven, and blender. Consider having supervision and share cooking responsibilities until no longer able to participate in those. Accidents with firearms and other hazards in the house should be identified and addressed as well.  DRIVING: Regarding driving, in patients with progressive memory problems, driving will be impaired. We advise to have someone else do the driving if trouble finding directions or if minor accidents are reported. Independent driving assessment is available to determine safety of driving.  ABILITY TO BE LEFT ALONE: If patient is unable to contact 911 operator, consider using LifeLine, or when the need is there, arrange for someone to stay with patients. Smoking is a fire hazard, consider supervision or cessation. Risk of wandering should be assessed by caregiver and if detected at any point, supervision and safe proof recommendations should be instituted.  MEDICATION SUPERVISION: Inability to self-administer medication needs to be constantly addressed. Implement a  mechanism to ensure safe administration of the medications.  RECOMMENDATIONS FOR ALL PATIENTS WITH MEMORY PROBLEMS: 1. Continue to exercise (Recommend 30 minutes of walking everyday, or 3 hours every week) 2. Increase social interactions - continue going to [place] and enjoy social gatherings with friends and family 3. Eat healthy, avoid fried foods and eat more fruits and vegetables 4. Maintain adequate blood pressure, blood sugar, and blood cholesterol level. Reducing the risk of stroke and cardiovascular disease also helps promoting better memory. 5. Avoid stressful situations. Live a simple life and avoid aggravations. Organize your time and prepare for the next day in anticipation. 6. Sleep well, avoid any interruptions of sleep and avoid any distractions in the bedroom that may interfere with adequate sleep quality 7. Avoid sugar, avoid sweets as there is a strong link between excessive sugar intake, diabetes, and cognitive impairment The Mediterranean diet has been shown to help patients reduce the risk of progressive memory disorders and reduces cardiovascular risk. This includes eating fish, eat fruits and green leafy vegetables, nuts like almonds and hazelnuts, walnuts, and also use olive oil. Avoid fast foods and fried foods as much as possible. Avoid sweets and sugar as sugar use has been linked to worsening of memory function.  There is always a concern of gradual progression of memory problems. If this is the case, then we may need to adjust level of care according to patient needs. Support, both to the patient and caregiver, should then be put into place.

## 2018-12-04 NOTE — Progress Notes (Signed)
NEUROLOGY FOLLOW UP OFFICE NOTE  Chris [name] [id number] 13-Feb-1946  HISTORY OF PRESENT ILLNESS: I had the pleasure of seeing Chris [name] in follow-up in the neurology clinic on 12/04/2018.  The patient was last seen 6 months ago for worsening memory. He is again accompanied by his wife who helps supplement the history today.  Records and images were personally reviewed where available. TSH and B12 unremarkable. I personally reviewed MRI brain with and without contrast done February 2020 which did not show any acute changes. There was stable small chronic infarct within the right lateral temporal lobe, stable mild chronic microvascular disease, moderate diffuse volume loss with prominent volume loss in the anteromedial temporal lobes and parahippocampal gyri (which can be seen with Alzheimer's or frontotemporal lobar degeneration). There were numerous foci of chronic microhemorrhages predominantly in the posterior distribution, greater on the left (favoring amyloid angiopathy).   He and his wife feel he is doing well, his wife does not think his memory is a lot worse, except maybe with names. He is taking Donepezil 10mg  daily without side effects. His wife helps with his medications because he gets confused. He continues to drive without getting lost. Wife has no driving concerns. His wife wonders if he is taking too much over the counter medication for headaches and sinus issues. He states he has an infection in the right nostril affecting his hearing, right ear is now deaf and left ear feels stuffed up. He uses a lot of nasal spray. He feels he has an infection with a lot of mucous, but has seen several ENTs. His wife states he is not in a good mood most of the time. No paranoia or hallucinations but he has a lot of depression. Buspar was recently increased to TID dosing. He does not see a therapist. He has occasional vertigo, no falls.   History on Initial Assessment 05/30/2018: This is a 73  year old right-handed man with a history of diet-controlled hypertension, depression, anxiety, presenting for evaluation of worsening memory. He states his memory is good and bad at certain things, she cannot recall what he did yesterday, he would not recall the date and have to look at his calendar. He denies getting lost driving. His wife occasionally reminds him to take his medications. He has frequent headaches and wants to take over the counter medication all day long. His wife manages bills. His wife states he has never been one to remember names, but in the past 6 months, she has noticed he would have difficulty thinking of the name of an object and get dates and times confused. He would ask her 5 times in one day what time his doctor appointment was. She denies any significant driving concerns. She has noticed that he gets really nervous and shaky around 2pm, then calms down by 8pm. Sleep is good, no wandering behavior. He still feels drowsy during the day. No paranoia or hallucinations. They report he had failed several grades in school, joined the [employer], then went to [employer] where he had a GPA of 3.85. It was in college where he was formally diagnosed with dyslexia. 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 There is no family history of dementia. No history of significant head injuries. He drinks 1 big can of beer a week, sometimes he drinks 3-4 times a week.   He has had daily headaches for several years and saw neurologist Dr. Domingo Cocking in 2018. He had trigger point  injections and became headache-free for a while, until he had a fall with loss of consciousness last September 2019 and headaches recurred. He has a moderate ache in the frontal region constantly, no nausea/vomiting, photo/phonophobia. He has been taking Tylenol and BC powders but states he does not take it quite everyday. He has dizziness with lightheadedness sometimes lasting all day until he takes meclizine  which helps. He denies any diplopia, dysarthria/dysphagia, back pain, bladder dysfunction, anosmia. He has occasional neck pain and constipation. He started noticing bilateral hand tremors 2-3 years ago which do not affect writing or using utensils. He fell while walking on a trail last September, he recalls starting feeling bad in his stomach and started walking faster, then passed out. He feels he was only out for a second, he does not remember hitting the ground but stood up and and realized he had fallen down. He had significant injury on the left eyelid and left side of his face and underwent plastic surgery. He still has stitches on the left lateral eyelid. He has occasional numbness and tingling in his right hand, it feels cold sometimes. He has noticed difficulty buttoning or tying shoelaces with his right hand. His wife has noticed shuffling when walking over the past year.  I personally reviewed MRI brain without contrast done 11/27/2016 which did not show any acute changes. There was scattered foci of susceptibility are present over the posterior left temporal lobe and occipital lobe, increased dural vasculature is suggested on axial image 8 of series 7. 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 There was moderate diffuse atrophy and mild chronic microvascular disease  PAST MEDICAL HISTORY: Past Medical History:  Diagnosis Date  . Anxiety   . Chest pain, unspecified   . Depression   . Dyslexia   . Fatigue   . Memory problem   . Seasonal allergies   . Unspecified essential hypertension     MEDICATIONS: Current Outpatient Medications on File Prior to Visit  Medication Sig Dispense Refill  . clonazePAM (KLONOPIN) 1 MG tablet Take 1 mg by mouth 2 (two) times daily.    Marland Kitchen donepezil (ARICEPT) 10 MG tablet Take 1/2 tablet daily for 2 weeks, then increase to 1 tablet daily 30 tablet 11  . DULoxetine (CYMBALTA) 30 MG capsule TAKE ONE CAPSULE BY MOUTH IN THE MORNING WITHOUT FOOD     . Meclizine HCl 25 MG CHEW Chew by mouth.    . Multiple Vitamins-Minerals (MULTIVITAL) tablet Take 1 tablet by mouth daily.      . Omega-3 Fatty Acids (FISH OIL) 1000 MG CAPS Take by mouth daily.      . polyvinyl alcohol (LIQUIFILM TEARS) 1.4 % ophthalmic solution 1 drop as needed.     No current facility-administered medications on file prior to visit.     ALLERGIES: No Known Allergies  FAMILY HISTORY: History reviewed. No pertinent family history.  SOCIAL HISTORY: Social History   Socioeconomic History  . Marital status: Single    Spouse name: Not on file  . Number of children: Not on file  . Years of education: Not on file  . Highest education level: Not on file  Occupational History  . Not on file  Social Needs  . Financial resource strain: Not on file  . Food insecurity    Worry: Not on file    Inability: Not on file  . Transportation needs    Medical: Not on file    Non-medical: Not on file  Tobacco  Use  . Smoking status: Former Smoker  . Smokeless tobacco: Never Used  Substance and Sexual Activity  . Alcohol use: No  . Drug use: No  . Sexual activity: Yes  Lifestyle  . Physical activity    Days per week: Not on file    Minutes per session: Not on file  . Stress: Not on file  Relationships  . Social Herbalist on phone: Not on file    Gets together: Not on file    Attends religious service: Not on file    Active member of club or organization: Not on file    Attends meetings of clubs or organizations: Not on file    Relationship status: Not on file  . Intimate partner violence    Fear of current or ex partner: Not on file    Emotionally abused: Not on file    Physically abused: Not on file    Forced sexual activity: Not on file  Other Topics Concern  . Not on file  Social History Narrative   Pt is right handed   Lives in single story home with his wife, Jeannene Patella   Has 2 adult children   Bachelors degree in Pensions consultant   Retired from  [employer] where he taught [place]: Constitutional: No fevers, chills, or sweats, no generalized fatigue, change in appetite Eyes: No visual changes, double vision, eye pain Ear, nose and throat: No hearing loss, ear pain, nasal congestion, sore throat Cardiovascular: No chest pain, palpitations Respiratory:  No shortness of breath at rest or with exertion, wheezes GastrointestinaI: No nausea, vomiting, diarrhea, abdominal pain, fecal incontinence Genitourinary:  No dysuria, urinary retention or frequency Musculoskeletal:  No neck pain, back pain Integumentary: No rash, pruritus, skin lesions Neurological: as above Psychiatric: No depression, insomnia, anxiety Endocrine: No palpitations, fatigue, diaphoresis, mood swings, change in appetite, change in weight, increased thirst Hematologic/Lymphatic:  No anemia, purpura, petechiae. Allergic/Immunologic: no itchy/runny eyes, nasal congestion, recent allergic reactions, rashes  PHYSICAL EXAM: Vitals:   12/04/18 1352  BP: (!) 151/95  Pulse: 82  SpO2: 93%   General: No acute distress Head:  Normocephalic/atraumatic Skin/Extremities: No rash, no edema Neurological Exam: alert and oriented to person, place, day of week. States it is 01/03/2000 (it is 12/04/2018). No aphasia or dysarthria. Fund of knowledge is reduced.  Recent and remote memory are impaired.  Attention and concentration are normal.    Able to name objects, difficulty with repetition. Montreal Cognitive Assessment  12/16/2018 05/30/2018  Visuospatial/ Executive (0/5) 3 5  Naming (0/3) 2 1  Attention: Read list of digits (0/2) 2 1  Attention: Read list of letters (0/1) 1 1  Attention: Serial 7 subtraction starting at 100 (0/3) 1 2  Language: Repeat phrase (0/2) 0 0  Language : Fluency (0/1) 0 1  Abstraction (0/2) 0 0  Delayed Recall (0/5) 0 0  Orientation (0/6) 2 4  Total 11 15   Cranial nerves: Pupils equal, round,  reactive to light. Extraocular movements intact with no nystagmus. Visual fields full. Facial sensation intact. No facial asymmetry. Tongue, uvula, palate midline.  Motor: Bulk and tone normal, muscle strength 5/5 throughout with no pronator drift. Finger to nose testing intact.  Gait slow and cautious, no ataxia.   IMPRESSION: This is a 73 yo RH man with a history of diet-controlled hypertension, depression, anxiety, and mild dementia. MOCA score today 11/30 (15/30 in February 2020). MRI brain  no acute changes, there was note of temporal lobe atrophy which can be seen in Alzheimer's disease and frontotemporal dementia, as well as chronic microhemorrhages seen with amyloid angiopathy. Neurocognitive testing will be ordered to help with diagnosis. Continue Donepezil 10mg  daily.  We again discussed the importance of control of vascular risk factors, physical exercise, and brain stimulation exercises for brain health. Follow-up in 6 months, they know to call for any changes.  Thank you for allowing me to participate in his care.  Please do not hesitate to call for any questions or concerns.  The duration of this appointment visit was 30 minutes of face-to-face time with the patient.  Greater than 50% of this time was spent in counseling, explanation of diagnosis, planning of further management, and coordination of care.   Ellouise Newer, M.D.   CC: Dr. Nadara Mustard

## 2018-12-04 NOTE — Telephone Encounter (Signed)
D:  [facility] Sharon Seller, NP) referred pt to [place] group.  A:  Placed call to # provided, to orient pt.  Pt's mother Jeannene Patella) answered phone, stating she didn't know what the NP had referred her son to.  According to mom, she doesn't know how a group program will work for her son.  She states she is currently driving, but would be willing to find out more about the program; but today isn't a good day.  A:  Myself or PHP (Whitney) will reach back out to her tomorrow.

## 2018-12-05 ENCOUNTER — Telehealth (HOSPITAL_COMMUNITY): Payer: Self-pay | Admitting: Psychiatry

## 2018-12-05 NOTE — Telephone Encounter (Signed)
D: Placed another call to pt's wife Jeannene Patella) as she requested yesterday.  According to wife; there's no way pt would be able to participate in any kind of group.  "He has memory issues and he wouldn't be able to get on a computer or call in."  A:  Will inform Sharon Seller, NP at [facility] that pt/wife declined [facility]-IOP and isn't appropriate for group.

## 2018-12-12 ENCOUNTER — Ambulatory Visit: Payer: Medicare Other | Admitting: Neurology

## 2019-01-01 ENCOUNTER — Ambulatory Visit: Payer: Medicare Other | Admitting: Neurology

## 2019-02-11 ENCOUNTER — Ambulatory Visit: Payer: Medicare Other

## 2019-02-11 ENCOUNTER — Encounter: Payer: Self-pay | Admitting: Psychology

## 2019-02-11 ENCOUNTER — Other Ambulatory Visit: Payer: Self-pay

## 2019-02-11 ENCOUNTER — Ambulatory Visit (INDEPENDENT_AMBULATORY_CARE_PROVIDER_SITE_OTHER): Payer: Medicare Other | Admitting: Psychology

## 2019-02-11 DIAGNOSIS — F0150 Vascular dementia without behavioral disturbance: Secondary | ICD-10-CM | POA: Diagnosis not present

## 2019-02-11 DIAGNOSIS — H918X9 Other specified hearing loss, unspecified ear: Secondary | ICD-10-CM

## 2019-02-11 DIAGNOSIS — F0390 Unspecified dementia without behavioral disturbance: Secondary | ICD-10-CM

## 2019-02-11 DIAGNOSIS — F03A0 Unspecified dementia, mild, without behavioral disturbance, psychotic disturbance, mood disturbance, and anxiety: Secondary | ICD-10-CM

## 2019-02-11 NOTE — Progress Notes (Signed)
NEUROPSYCHOLOGICAL EVALUATION [facility] Department of Neurology  Reason for Referral:   Chris [name]. is a 73 y.o. Caucasian male referred by Ellouise Newer, M.D., to characterize his current cognitive functioning and assist with diagnostic clarity and treatment planning in the context of subjective cognitive decline, potential psychiatric comorbidities, and neuroimaging suggesting concern surrounding neurodegenerative illness.  Assessment and Plan:   Clinical Impression(s): Chris [name]'s pattern of performance is suggestive of notable cognitive dysfunction across several domains including processing speed, attention/concentration, cognitive flexibility, response inhibition, hypothesis testing and problem solving, semantic fluency, confrontation naming, visuospatial functioning, verbal memory (encoding, retrieval, and consolidation), and visual memory (retrieval). Relative strengths were exhibited across nonverbal abstract reasoning, judgment, receptive language, and visual discrimination. Overall, given evidence for cognitive dysfunction, coupled with Chris [name] report of difficulties performing instrumental activities of daily living (ADLs) independently, he meets criteria for a Major Neurocognitive Disorder (formerly "dementia") and is likely towards the mild end of this spectrum presently.  The etiology for Chris [name]'s cognitive deficits is likely multifactorial in nature. Neuroimaging suggesting history of a right lateral temporal lobe stroke and white matter changes mildly advanced for his age suggest a potential vascular contribution which could partially explain deficits in processing speed, attention/concentration, and executive functioning. Additionally, Chris [name] exhibited prominent difficulties with verbal learning and memory. He was amnestic when attempting to recall previously learned stories and exhibited notable trouble with both retrieval and  consolidation efforts across a list learning task. While visual memory was improved, he continued to exhibit trouble with retrieval. This, coupled with impaired performances across confrontation naming, semantic fluency, and executive functioning, is also concerning for Alzheimer's disease. Neuroimaging, especially that suggesting the presence of amyloid angiopathy, is consistent with this presentation as this is generally viewed as a major contributor to Alzheimer's disease pathogenesis. Continued medical monitoring will be important moving forward.  Recommendations: Repeat neuropsychological evaluation in 12-18 months (or sooner if functional decline is noted) is recommended to assess the trajectory of future cognitive decline should it occur. This will also aid in future efforts towards improved diagnostic clarity.  Chris [name] is strongly encouraged to discuss his ongoing hearing loss with his medical team and a referral to an ENT and/or audiologist should be considered if not already done. This will be important to address moving forward, as uncorrected hearing loss has been shown to increase one's risk for future cognitive decline. The utilization of hearing aids or other forms of treatment should be accomplished prior to any repeat evaluation as hearing loss can certainly affect cognitive performance, especially surrounding verbal memory.   Should there be a progression of his current deficits over time, Chris [name] is unlikely to regain any independent living skills lost. Therefore, it is recommended that he remain as involved as possible in all aspects of household chores, finances, and medication management, with supervision to ensure adequate performance. He will likely benefit from the establishment and maintenance of a routine in order to maximize his functional abilities over time.  It will be important for Chris [name] to have another person with him when in situations where he may need to  process information, weigh the pros and cons of different options, and make decisions, in order to ensure that he fully understands and recalls all information to be considered. Important information should be provided in written format whenever possible.  If not already done, Chris [name] and his family may want to discuss his wishes regarding durable power of attorney  and medical decision making, so that he can have input into these choices. Additionally, they may wish to discuss future plans for caretaking and seek out community options for in home/residential care should they become necessary.  To address problems with fluctuating attention, he may wish to consider:   -Avoiding external distractions when needing to concentrate   -Limiting exposure to fast paced environments with multiple sensory demands   -Writing down complicated information and using checklists   -Attempting and completing one task at a time (i.e., no multi-tasking)   -Reducing the amount of information considered at one time  Review of Records:   Chris [name] was seen by [facility] Mar[facility] KitchenEllouise Newer, M.D.) on 12/04/2018 for follow-up of memory concerns. He reported that his memory is good and bad, depending on the task at hand or how he is feeling throughout the day. His wife reported difficulties with recalling what he did yesterday, needing to look at the calendar to remember the current date, forgetting to take his medications, trouble with word finding (especially names), asking repetitive questions, and brief periods of apparent confusion. These were said to have worsened over the past 6 months. His wife also reported that she manages his medications and personal finances. Chris [name] drives without issue. Recently, he and his wife noted that his memory seems stable; he recently started taking donepezil 10mg  daily without known side effects. He reported recurring sinus infections, leading to hearing loss. A history of  depression and anxiety was also reported. Ultimately, Chris [name] was referred for a comprehensive neuropsychological evaluation to characterize his cognitive abilities and to assist with diagnostic clarity and treatment planning.   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 Brain MRI on 06/15/2018 was stable relative to prior imaging. However, imaging suggested asymmetric volume loss in the anteromedial temporal lobes and parahippocampal gyrus, which can be associated with neurodegenerative disease, as well as numerous foci of chronic microhemorrhage in a predominantly peripheral distribution favoring amyloid angiopathy. Brain MRA on 06/15/2018 was unremarkable.  Past Medical History:  Diagnosis Date   Anxiety    Chest pain, unspecified    Depression    Dyslexia    Hearing loss    Attributed to recurring sinus infections   Seasonal allergies    Stroke ([place])    Right lateral temporal lobe   Unspecified essential hypertension     Past Surgical History:  Procedure Laterality Date   HIP ARTHROPLASTY     TOTAL KNEE ARTHROPLASTY      No family history on file.   Current Outpatient Medications:    busPIRone (BUSPAR) 15 MG tablet, Take 15 mg by mouth 2 (two) times daily., Disp: , Rfl:    clonazePAM (KLONOPIN) 1 MG tablet, Take 1 mg by mouth 2 (two) times daily., Disp: , Rfl:    donepezil (ARICEPT) 10 MG tablet, Take 1 tablet daily, Disp: 90 tablet, Rfl: 3   DULoxetine (CYMBALTA) 30 MG capsule, TAKE ONE CAPSULE BY MOUTH IN THE MORNING WITHOUT FOOD, Disp: , Rfl:    Meclizine HCl 25 MG CHEW, Chew by mouth., Disp: , Rfl:    Multiple Vitamins-Minerals (MULTIVITAL) tablet, Take 1 tablet by  mouth daily.  , Disp: , Rfl:   Clinical Interview:  Cognitive Symptoms: Decreased short-term memory: Endorsed. Chris [name] described prominent difficulties remembering the names of individuals, but described this as a longstanding weakness dating back at least 10 years. He denied difficulties with remembering details of previous conversations or events, misplacing objects around the home, or asking repetitive questions as mentioned in other medical records. Decreased long-term memory: Denied. Decreased attention/concentration: Denied. Increased ease of distractibility: Denied. Reduced processing speed: Endorsed. Slowed processing speed was said to occur occasionally.  Difficulties with executive functions: Denied. Difficulties with emotion regulation: Denied. Difficulties with receptive language: Denied under the assumption that he is able to hear the source of the sound appropriately. Difficulties with word finding: Denied. Decreased visuoperceptual ability: Denied.  Difficulties completing ADLs: Endorsed. Chris [name] reported that his wife manages his medications and personal finances. He also acknowledged that he would likely have difficulty completing these tasks if required to do so independently. He currently drives without issue.   Additional Medical History: History of traumatic brain injury/concussion: Denied. History of stroke: Denied. Prior neuroimaging suggested a remote right lateral temporal lobe infarct. When asked about this, Chris [name] did not recall ever receiving this news and was unaware of this aspect of his medical history. History of seizure activity: Denied. History of known exposure to toxins: Denied. Symptoms of chronic pain: Endorsed. He reported manageable levels of pain in his hips.  Experience of frequent headaches/migraines: Endorsed. He reported daily headaches, generally attributed to his acute history of recurring sinus infections. He reported taking a  "headache pill" with positive effect.  Frequent instances of dizziness/vertigo: Endorsed. Symptoms of dizziness were said to occur occasionally, generally when he is up and walking around. He reported a history of falls, most recently 1.5 years ago where he fell and injured the area surrounding his left eye.   Sensory changes: Stemming from recurring sinus infections, Mr. Fosberg noted losing his hearing in his right ear entirely, as well as experiencing some diminished hearing in his left ear. He noted that nasal sprays and medications seem to help symptoms in the short-term, but have been unable to fix this recurring condition. He also reported utilizing corrective lenses with positive effect. Other sensory changes/difficulties (e.g., taste or smell) were denied.  Balance/coordination difficulties: Denied outside what is described above surrounding dizziness. He did acknowledge using external sources for support while he walks around his home as a precautionary measure.  Other motor difficulties: Denied.  Sleep History: Estimated hours obtained each night: 8 hours. He described his sleep as "great" overall. Difficulties falling asleep: Denied. Difficulties staying asleep: Endorsed. He reported waking up throughout the night upwards of 4 times. He reported generally being able to fall back asleep quickly.  Feels rested and refreshed upon awakening: Endorsed.  History of snoring: Denied. History of waking up gasping for air: Denied. Witnessed breath cessation while asleep: Denied.  History of vivid dreaming: Denied. Excessive movement while asleep: Denied. Instances of acting out his dreams: Denied.  Psychiatric/Behavioral Health History: Depression: Denied. Mr. Schwiesow described his current mood as "good" and denied ever being diagnosed with depression. However, medical records suggest a prior diagnosis, as well as suspected significant symptoms of depression during his last visit with Dr.  Delice Lesch in August 2020. Current or remote suicidal ideation, intent, or plan was denied.  Anxiety: Endorsed. He reported mild symptoms of anxiety, generally felt in the morning, which dissipate quickly throughout the day. He reported being prescribed anxiety-related medications, which were described as helpful overall. Mania: Denied. Trauma History: Denied. Visual/auditory hallucinations:  Denied. Delusional thoughts: Denied. Mental health treatment: Denied.  Tobacco: Denied. Alcohol: Chris [name] denied current alcohol consumption, as well as a history of problematic alcohol use, abuse, or dependence. He did acknowledge prior alcohol consumption rates of 1 beer per day.  Recreational drugs: Denied. Caffeine: Denied.  Academic/Vocational History: Highest level of educational attainment: 16 years. Chris [name] completed high school and earned a Water quality scientist degree from [employer]. He reported failing several grades during early academic settings, as well as being a "slow reader." Medical records suggest a history of dyslexia not diagnosed until college. When asked, Chris [name] was unsure about being diagnosed with this condition and seemingly was not aware that it was in his medical records. During college, he reported strong academic performance, holding a 3.85 GPA.  History of developmental delay: Denied. History of grade repetition: Endorsed. History of class failures: Endorsed. Enrollment in special education courses: Denied. History of diagnosed specific learning disability: Endorsed. See above. History of ADHD: Denied.  Employment: Retired. Mr. Follen served for 4 years in the [place]. He later taught shop class for 25 years.   Evaluation Results:   Behavioral Observations: Mr. Weisenbach was unaccompanied, arrived to his appointment on time, and was appropriately dressed and groomed. Hearing loss was apparent and he often required questions to be repeated during the  clinical interview. Observed gait and station were within normal limits. Gross motor functioning appeared intact upon informal observation and tremors were noted. However, he did exhibit hand wringing/picking behaviors throughout the entirety of the interview and testing procedures. His affect was generally relaxed and positive, but did range appropriately given the subject being discussed during the clinical interview or the task at hand during testing procedures. He also appeared confused regarding the purpose of the current evaluation and why he was referred. Spontaneous speech was fluent and word finding difficulties were not observed during the clinical interview. However, the latter was noteworthy during cognitive testing procedures. Sustained attention was appropriate throughout. Thought processes were tangential at times, but coherent and normal in content. Task engagement was adequate and he largely persisted when challenged. However, he did experience frustration during one executive functioning test (20 Questions), which was discontinued in response. He also appeared to rush into tasks prior to hearing the full instructions rather than waiting and creating a mental plan for how to proceed. Overall, Mr. Chesky was cooperative with the clinical interview and subsequent testing procedures.   Adequacy of Effort: The validity of neuropsychological testing is limited by the extent to which the individual being tested may be assumed to have exerted adequate effort during testing. Mr. Strimple expressed his intention to perform to the best of his abilities and exhibited adequate task engagement and persistence. Scores across stand-alone and embedded performance validity measures were within expectation. As such, the results of the current evaluation are believed to be a valid representation of Mr. Ramberg's current cognitive functioning.  Test Results: Mr. Vandekamp was generally oriented at the time of the  current evaluation. Points were lost for being unable to name his current location.  Intellectual abilities based upon educational and vocational attainment were estimated to be in the average range. Premorbid abilities were estimated to be within the well below average range based upon a single-word reading test, likely reflective of his history of dyslexia.   Processing speed was exceptionally low to below average. Basic attention was exceptionally low. More complex attention (e.g., working memory) was below average. Executive functioning was variable. Cognitive flexibility,  response inhibition, hypothesis testing, and problem solving were exceptionally low to well below average. Nonverbal abstract reasoning and judgment were average to above average.  Assessed receptive language abilities were within normal limits. While Mr. Filar did exhibit difficulties comprehending task instructions at times, these difficulties may have been due to significant hearing loss. Assessed expressive language (e.g., verbal fluency and confrontation naming) was variable. Phonemic fluency was below average, semantic fluency was well below average, and confrontation naming was exceptionally low.    Assessed visuospatial/visuoconstructional abilities were variable. Visual discrimination was average, while visuoconstructional abilities and those requiring spatial manipulation were exceptionally low. Notably, when asked to "draw the face of a clock," he placed ears on his design to symbolize an actual human face. When asked to place the "hands" on the clock, he attempted to outline his physical hand. It was unclear if this represented true cognitive dysfunction or limited task engagement.    Learning (i.e., encoding) of novel verbal information was well below average to exceptionally low, while learning visual information was well above average. Spontaneous delayed recall (i.e., retrieval) of previously learned information  exceptionally low to below average, with a relative strength across visual retention. Retention rates were poor across memory measures and he was entirely amnestic surrounding a story learning task. Performance across a list learning recognition task was poor overall, suggesting limited evidence for appropriate information consolidation.   Results of emotional screening instruments suggested that recent symptoms of generalized anxiety were in the minimal range, while symptoms of depression were within normal limits. A screening instrument assessing recent sleep quality suggested the presence of minimal sleep dysfunction.  Tables of Scores:   Note: This summary of test scores accompanies the interpretive report and should not be considered in isolation without reference to the appropriate sections in the text. Descriptors are based on appropriate normative data and may be adjusted based on clinical judgment. The terms impaired and within normal limits (WNL) are used when a more specific level of functioning cannot be determined.       Effort Testing:   DESCRIPTOR       ACS Word Choice: --- --- Within Expectation    *Relative to 73 y/o norms     Dot Counting Test: --- --- Within Expectation  CVLT-III Forced Choice Recognition: --- --- Within Expectation       Cognitive Screening:          NAB Screening Battery, Form 2 Standard Score/ T Score Percentile   Total Score 62 1 Exceptionally Low    Orientation 27/29 --- ---  Attention Domain 61 <1 Exceptionally Low    Digits Forward 24 <1 Exceptionally Low    Digits Backwards 40 16 Below Average    Letters & Numbers A Efficiency 34 5 Well Below Average    Letters & Numbers B Efficiency 33 5 Well Below Average  Language Domain 4 6 Well Below Average    Auditory Comprehension 52 58 Average    Naming 19 <1 Exceptionally Low  Memory Domain 82 12 Below Average    Shape Learning Immediate Recognition 88 96 Well Above Average    Story Learning  Immediate Recall 35 7 Well Below Average    Shape Learning Delayed Recognition 42 21 Below Average    Story Learning Delayed Recall 23 <1 Exceptionally Low  Spatial Domain 80 9 Below Average    Visual Discrimination 55 69 Average    Design Construction 23 <1 Exceptionally Low  Executive Functions Domain 66 1 Exceptionally Low  Mazes 36 8 Well Below Average    Word Generation 26 1 Exceptionally Low       Intellectual Functioning:           Standard Score Percentile   Test of Premorbid Functioning: 26 5 Well Below Average       Memory:          [place] Verbal Learning Test (CVLT-III) Brief Form: Raw Score (Scaled/Standard Score) Percentile     Total Trials 1-4 16/36 (63) 1 Exceptionally Low    Short-Delay Free Recall 3/9 (2) <1 Exceptionally Low    Long-Delay Free Recall 0/9 (1) <1 Exceptionally Low    Long-Delay Cued Recall 2/9 (1) <1 Exceptionally Low      Recognition Hits 8/9 (10) 50 Average      False Positive Errors 5 (2) <1 Exceptionally Low       Attention/Executive Function:          Trail Making Test (TMT): Raw Score (T Score) Percentile     Part A 50 secs.,  0 errors (40) 16 Below Average    Part B 259 secs.,  2 errors (31) 3 Well Below Average       D-KEFS Color-Word Interference Test: Raw Score (Scaled Score) Percentile     Color Naming 57 secs. (1) <1 Exceptionally Low    Word Reading 40 secs. (3) 1 Exceptionally Low    Inhibition 109 secs. (4) 2 Well Below Average      Total Errors 26 errors (1) <1 Exceptionally Low    Inhibition/Switching 160 secs. (1) <1 Exceptionally Low      Total Errors 13 errors (1) <1 Exceptionally Low       D-KEFS 20 Questions Test: Scaled Score Percentile     Total Weighted Achievement Score Discontinued --- Impaired    Initial Abstraction Score --- --- ---       NAB Executive Functions Module, Form 1: T Score Percentile     Judgment 58 79 Above Average       Language:          Verbal Fluency Test: Raw Score (T Score)  Percentile     Phonemic Fluency (FAS) 30 (42) 21 Below Average    Animal Fluency 12 (36) 8 Well Below Average       NAB Language Module, Form 2: T Score Percentile     Naming 19/31 (19) <1 Exceptionally Low       Visuospatial/Visuoconstruction:      Raw Score Percentile   Clock Drawing: 5/10 --- Impaired        Scaled Score Percentile   WAIS-IV Matrix Reasoning: 12 75 Above Average       Mood and Personality:      Raw Score Percentile   Geriatric Depression Scale: 3 --- Within Normal Limits  Geriatric Anxiety Scale: 8 --- Minimal    Somatic 5 --- Minimal    Cognitive 2 --- Minimal    Affective 1 --- Minimal       Additional Questionnaires:      Raw Score Percentile   PROMIS Sleep Disturbance Questionnaire: 9 --- None to Slight   Informed Consent and Coding/Compliance:   Mr. Abdulrahman was provided with a verbal description of the nature and purpose of the present neuropsychological evaluation. Also reviewed were the foreseeable risks and/or discomforts and benefits of the procedure, limits of confidentiality, and mandatory reporting requirements of this provider. The patient was given the opportunity to ask questions and receive answers about the  evaluation. Oral consent to participate was provided by the patient.   This evaluation was conducted by Christia Reading, Ph.D., licensed clinical neuropsychologist. Mr. Fare completed a 30-minute clinical interview, billed as one unit [phone], and 165 minutes of cognitive testing, billed as one unit [phone] and five additional units [id number]. Psychometrist Cruzita Lederer, B.S., assisted Dr. Melvyn Novas with test administration and scoring procedures. As a separate and discrete service, Dr. Melvyn Novas spent a total of 180 minutes in interpretation and report writing, billed as one unit 96132 and two units 96133.

## 2019-02-11 NOTE — Progress Notes (Signed)
   Neuropsychology Note   Chris [name]. completed 150 minutes of neuropsychological testing with technician, Cruzita Lederer, B.S., under the supervision of Dr. Christia Reading, Ph.D., licensed neuropsychologist. The patient did not appear overtly distressed by the testing session, per behavioral observation or via self-report to the technician. Rest breaks were offered.    In considering the patient's current level of functioning, level of presumed impairment, nature of symptoms, emotional and behavioral responses during the interview, level of literacy, and observed level of motivation/effort, a battery of tests was selected and communicated to the psychometrician.   Communication between the psychologist and technician was ongoing throughout the testing session and changes were made as deemed necessary based on patient performance on testing, technician observations and additional pertinent factors such as those listed above.   Chris [name]. will return within approximately two weeks for an interactive feedback session with Dr. Melvyn Novas at which time his test performances, clinical impressions, and treatment recommendations will be reviewed in detail. The patient understands he can contact our office should he require our assistance before this time.   Full report to follow.  150 minutes were spent face-to-face with patient administering standardized tests and 15 minutes were spent scoring (technician). [CPT [id number], [id number]

## 2019-02-12 ENCOUNTER — Encounter: Payer: Self-pay | Admitting: Psychology

## 2019-02-12 DIAGNOSIS — H9190 Unspecified hearing loss, unspecified ear: Secondary | ICD-10-CM | POA: Insufficient documentation

## 2019-02-12 DIAGNOSIS — F0390 Unspecified dementia without behavioral disturbance: Secondary | ICD-10-CM | POA: Insufficient documentation

## 2019-02-12 DIAGNOSIS — F0150 Vascular dementia without behavioral disturbance: Secondary | ICD-10-CM | POA: Insufficient documentation

## 2019-02-18 ENCOUNTER — Encounter: Payer: Self-pay | Admitting: Psychology

## 2019-02-18 ENCOUNTER — Ambulatory Visit (INDEPENDENT_AMBULATORY_CARE_PROVIDER_SITE_OTHER): Payer: Medicare Other | Admitting: Psychology

## 2019-02-18 ENCOUNTER — Other Ambulatory Visit: Payer: Self-pay

## 2019-02-18 DIAGNOSIS — F0150 Vascular dementia without behavioral disturbance: Secondary | ICD-10-CM

## 2019-02-18 DIAGNOSIS — F0390 Unspecified dementia without behavioral disturbance: Secondary | ICD-10-CM

## 2019-02-18 NOTE — Progress Notes (Signed)
   Neuropsychology Feedback Session Chris [name]. [place] Department of Neurology  Reason for Referral:   Chris [name].is a 73 y.o. Caucasian male referred by Chris [name], M.D.,to characterize hiscurrent cognitive functioning and assist with diagnostic clarity and treatment planning in the context of subjective cognitive decline, potential psychiatric comorbidities, and neuroimaging suggesting concern surrounding neurodegenerative illness.  Feedback:   Chris [name] completed a comprehensive neuropsychological evaluation on 02/11/2019. Please refer to that encounter for the full report. Briefly, results suggested notable cognitive dysfunction across several domains including processing speed, attention/concentration, cognitive flexibility, response inhibition, hypothesis testing and problem solving, semantic fluency, confrontation naming, visuospatial functioning, verbal memory (encoding, retrieval, and consolidation), and visual memory (retrieval). given evidence for cognitive dysfunction, coupled with Chris [name] report of difficulties performing instrumental activities of daily living (ADLs) independently, he meets criteria for a major neurocognitive disorder (formerly "dementia") and is likely towards the mild end of this spectrum presently. Neuroimaging, especially that suggesting the presence of amyloid angiopathy, is consistent with this presentation as this is generally viewed as a major contributor to Alzheimer's disease pathogenesis. Recommendations included a repeat evaluation in 12-18 months, as well as follow-up with an ENT and/or audiologist to assess ongoing hearing loss.  Chris [name] was accompanied by his wife. Content of the current session focused on the results of the current evaluation, as well as neuroimaging suggesting the likely presence of amyloid angiopathy. Chris [name] and his wife were given the opportunity to ask questions and their questions were  answered. They were also encouraged to reach out should additional questions arise. A copy of his report was provided at the conclusion of the visit.      A total of 20 minutes were spent with Chris [name] and his wife during the current feedback session.

## 2019-05-07 ENCOUNTER — Encounter (INDEPENDENT_AMBULATORY_CARE_PROVIDER_SITE_OTHER): Payer: Medicare PPO | Admitting: Ophthalmology

## 2019-05-07 ENCOUNTER — Other Ambulatory Visit: Payer: Self-pay

## 2019-05-07 DIAGNOSIS — I10 Essential (primary) hypertension: Secondary | ICD-10-CM

## 2019-05-07 DIAGNOSIS — H43813 Vitreous degeneration, bilateral: Secondary | ICD-10-CM

## 2019-05-07 DIAGNOSIS — H3561 Retinal hemorrhage, right eye: Secondary | ICD-10-CM

## 2019-05-07 DIAGNOSIS — H35033 Hypertensive retinopathy, bilateral: Secondary | ICD-10-CM | POA: Diagnosis not present

## 2019-05-07 DIAGNOSIS — D3131 Benign neoplasm of right choroid: Secondary | ICD-10-CM

## 2019-05-07 DIAGNOSIS — H2513 Age-related nuclear cataract, bilateral: Secondary | ICD-10-CM

## 2019-07-02 DIAGNOSIS — Z6828 Body mass index (BMI) 28.0-28.9, adult: Secondary | ICD-10-CM | POA: Diagnosis not present

## 2019-07-02 DIAGNOSIS — I10 Essential (primary) hypertension: Secondary | ICD-10-CM | POA: Diagnosis not present

## 2019-07-02 DIAGNOSIS — D400 Neoplasm of uncertain behavior of prostate: Secondary | ICD-10-CM | POA: Diagnosis not present

## 2019-07-02 DIAGNOSIS — F411 Generalized anxiety disorder: Secondary | ICD-10-CM | POA: Diagnosis not present

## 2019-07-04 ENCOUNTER — Ambulatory Visit (INDEPENDENT_AMBULATORY_CARE_PROVIDER_SITE_OTHER): Payer: Medicare PPO | Admitting: Neurology

## 2019-07-04 ENCOUNTER — Encounter: Payer: Self-pay | Admitting: Neurology

## 2019-07-04 ENCOUNTER — Other Ambulatory Visit: Payer: Self-pay

## 2019-07-04 ENCOUNTER — Other Ambulatory Visit: Payer: Medicare PPO

## 2019-07-04 VITALS — BP 139/83 | HR 69 | Ht 67.0 in | Wt 185.6 lb

## 2019-07-04 DIAGNOSIS — F0390 Unspecified dementia without behavioral disturbance: Secondary | ICD-10-CM

## 2019-07-04 DIAGNOSIS — R413 Other amnesia: Secondary | ICD-10-CM

## 2019-07-04 DIAGNOSIS — F03A0 Unspecified dementia, mild, without behavioral disturbance, psychotic disturbance, mood disturbance, and anxiety: Secondary | ICD-10-CM

## 2019-07-04 DIAGNOSIS — G43009 Migraine without aura, not intractable, without status migrainosus: Secondary | ICD-10-CM | POA: Diagnosis not present

## 2019-07-04 LAB — VITAMIN B12: Vitamin B-12: 857 pg/mL (ref 200–1100)

## 2019-07-04 MED ORDER — DONEPEZIL HCL 10 MG PO TABS: ORAL_TABLET | ORAL | 3 refills | Status: DC

## 2019-07-04 MED ORDER — MEMANTINE HCL 10 MG PO TABS: ORAL_TABLET | ORAL | 11 refills | Status: DC

## 2019-07-04 NOTE — Patient Instructions (Addendum)
1. Bloodwork for B12 level  2. Start Memantine 10mg : take 1 tablet every night for 2 weeks, then increase to 1 tablet twice a day  3. Continue Donepezil 10mg  daily  4. Recommend hearing evaluation  5. Minimize over the counter pain medication to 2-3 times a week, taking these daily can cause more headaches  6. Continue to monitor driving  7. Follow-up in 6 months, call for any changes
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
## 2019-07-10 ENCOUNTER — Telehealth: Payer: Self-pay

## 2019-07-10 NOTE — Telephone Encounter (Signed)
Left a message for pt to call back to go over lab results B12 level was normal

## 2019-07-10 NOTE — Telephone Encounter (Signed)
-----   Message from Cameron Sprang, MD sent at 07/10/2019  9:52 AM EDT ----- Pls let him know B12 level was normal, thanks

## 2019-07-10 NOTE — Telephone Encounter (Signed)
Wife called back and was given pt Vit B12 level results was normal

## 2019-07-15 DIAGNOSIS — F419 Anxiety disorder, unspecified: Secondary | ICD-10-CM | POA: Diagnosis not present

## 2019-07-15 DIAGNOSIS — F332 Major depressive disorder, recurrent severe without psychotic features: Secondary | ICD-10-CM | POA: Diagnosis not present

## 2019-07-15 DIAGNOSIS — G3184 Mild cognitive impairment, so stated: Secondary | ICD-10-CM | POA: Diagnosis not present

## 2019-07-27 DIAGNOSIS — M545 Low back pain: Secondary | ICD-10-CM | POA: Diagnosis not present

## 2019-07-27 DIAGNOSIS — I700 Atherosclerosis of aorta: Secondary | ICD-10-CM | POA: Diagnosis not present

## 2019-07-27 DIAGNOSIS — R109 Unspecified abdominal pain: Secondary | ICD-10-CM | POA: Diagnosis not present

## 2019-07-27 DIAGNOSIS — N200 Calculus of kidney: Secondary | ICD-10-CM | POA: Diagnosis not present

## 2019-07-27 DIAGNOSIS — N132 Hydronephrosis with renal and ureteral calculous obstruction: Secondary | ICD-10-CM | POA: Diagnosis not present

## 2019-07-27 DIAGNOSIS — R1032 Left lower quadrant pain: Secondary | ICD-10-CM | POA: Diagnosis not present

## 2019-07-27 DIAGNOSIS — R197 Diarrhea, unspecified: Secondary | ICD-10-CM | POA: Diagnosis not present

## 2019-07-27 DIAGNOSIS — Z79899 Other long term (current) drug therapy: Secondary | ICD-10-CM | POA: Diagnosis not present

## 2019-07-27 DIAGNOSIS — M1990 Unspecified osteoarthritis, unspecified site: Secondary | ICD-10-CM | POA: Diagnosis not present

## 2019-07-27 DIAGNOSIS — K5790 Diverticulosis of intestine, part unspecified, without perforation or abscess without bleeding: Secondary | ICD-10-CM | POA: Diagnosis not present

## 2019-08-01 ENCOUNTER — Telehealth: Payer: Self-pay | Admitting: Urology

## 2019-08-01 NOTE — Telephone Encounter (Signed)
Created in error

## 2019-08-04 ENCOUNTER — Encounter: Payer: Self-pay | Admitting: Urology

## 2019-08-04 ENCOUNTER — Ambulatory Visit: Payer: Medicare PPO | Admitting: Urology

## 2019-08-04 ENCOUNTER — Other Ambulatory Visit: Payer: Self-pay

## 2019-08-04 VITALS — BP 117/70 | HR 86 | Temp 97.6°F | Ht 68.0 in | Wt 182.0 lb

## 2019-08-04 DIAGNOSIS — N200 Calculus of kidney: Secondary | ICD-10-CM

## 2019-08-04 DIAGNOSIS — R972 Elevated prostate specific antigen [PSA]: Secondary | ICD-10-CM

## 2019-08-04 LAB — POCT URINALYSIS DIPSTICK
Bilirubin, UA: NEGATIVE
Blood, UA: NEGATIVE
Glucose, UA: NEGATIVE
Ketones, UA: NEGATIVE
Leukocytes, UA: NEGATIVE
Nitrite, UA: NEGATIVE
Protein, UA: POSITIVE — AB
Spec Grav, UA: >=1.03 — AB (ref 1.010–1.025)
Urobilinogen, UA: 0.2 E.U./dL
pH, UA: 5 (ref 5.0–8.0)

## 2019-08-04 MED ORDER — TAMSULOSIN HCL 0.4 MG PO CAPS: 0.4000 mg | ORAL_CAPSULE | Freq: Every day | ORAL | 3 refills | Status: DC

## 2019-08-04 NOTE — Patient Instructions (Signed)

## 2019-08-04 NOTE — Progress Notes (Signed)
Urological Symptom Review  Patient is experiencing the following symptoms: Frequent urination Get up at night to urinate Leakage of urine Erection problems (male only)   Review of Systems  Gastrointestinal (upper)  : Negative for upper GI symptoms  Gastrointestinal (lower) : Negative for lower GI symptoms  Constitutional : Negative for symptoms  Skin: Negative for skin symptoms  Eyes: Negative for eye symptoms  Ear/Nose/Throat : Sinus problems  Hematologic/Lymphatic: Negative for Hematologic/Lymphatic symptoms  Cardiovascular : Negative for cardiovascular symptoms  Respiratory : Negative for respiratory symptoms  Endocrine: Negative for endocrine symptoms  Musculoskeletal: Negative for musculoskeletal symptoms  Neurological: Headaches Dizziness  Psychologic: Depression Anxiety

## 2019-08-04 NOTE — Progress Notes (Signed)
08/04/2019 2:02 PM   Chris [name]. Oct 14, 1945 [id number]  Referring provider: Rory Percy, MD [place],  [place] [address]  Elevated PSA and nephrolithiasis  HPI: Mr Picton is a 74yo here for evaluation of nephrolithiasis and elevated PSA. 9 days ago he developed severe left flank pain and presented to [facility] and was diagnosed with a 33mm left ureteral calculus. His last pain episode was 4 days ago. He has been on flomax for 9 days and has not noted a change in his LUTS.  He has a PSA of 6.0 and was previously followed by Dr. Exie Parody. Per patient PSA has been as high as 12. He has 2 negative prostate biopsies. No worsening LUTS.    PMH: Past Medical History:  Diagnosis Date  . Anxiety   . Chest pain, unspecified   . Depression   . Dyslexia   . Hearing loss    Attributed to recurring sinus infections  . Seasonal allergies   . Stroke [facility])    Right lateral temporal lobe  . Unspecified essential hypertension     Surgical History: Past Surgical History:  Procedure Laterality Date  . HIP ARTHROPLASTY    . TOTAL KNEE ARTHROPLASTY      Home Medications:  Allergies as of 08/04/2019   No Known Allergies     Medication List       Accurate as of August 04, 2019  2:02 PM. If you have any questions, ask your nurse or doctor.        acetaminophen 325 MG tablet Commonly known as: TYLENOL Take 650 mg by mouth every 6 (six) hours as needed.   BC HEADACHE PO Take by mouth.   busPIRone 15 MG tablet Commonly known as: BUSPAR Take 15 mg by mouth 2 (two) times daily.   clonazePAM 1 MG tablet Commonly known as: KLONOPIN Take 1 mg by mouth 2 (two) times daily.   desvenlafaxine 50 MG 24 hr tablet Commonly known as: PRISTIQ   donepezil 10 MG tablet Commonly known as: ARICEPT Take 1 tablet daily   fluticasone 50 MCG/ACT nasal spray Commonly known as: FLONASE   HYDROcodone-acetaminophen 5-325 MG tablet Commonly known as: NORCO/VICODIN   ibuprofen  200 MG tablet Commonly known as: ADVIL Take 200 mg by mouth every 6 (six) hours as needed.   losartan 25 MG tablet Commonly known as: COZAAR Take 25 mg by mouth daily.   Meclizine HCl 25 MG Chew Chew by mouth.   memantine 10 MG tablet Commonly known as: Namenda Take 1 tablet every night for 2 weeks, then increase to 1 tablet twice a day   montelukast 10 MG tablet Commonly known as: SINGULAIR   Mucinex Sinus-Max 5-10-200-325 MG Tabs Generic drug: Phenylephrine-DM-GG-APAP Take by mouth.   Multivital tablet Take 1 tablet by mouth daily.   tamsulosin 0.4 MG Caps capsule Commonly known as: FLOMAX   vitamin B-12 100 MCG tablet Commonly known as: CYANOCOBALAMIN Take 100 mcg by mouth daily.       Allergies: No Known Allergies  Family History: Family History  Problem Relation Age of Onset  . Heart disease Mother   . Cancer Father     Social History:  reports that he quit smoking about 50 years ago. He has a 1.00 pack-year smoking history. He has never used smokeless tobacco. He reports that he does not drink alcohol or use drugs.  ROS: All other review of systems were reviewed and are negative except what is noted  above in HPI  Physical Exam: BP 117/70   Pulse 86   Temp 97.6 F (36.4 C)   Ht 5\' 8"  (1.727 m)   Wt 182 lb (82.6 kg)   BMI 27.67 kg/m   Constitutional:  Alert and oriented, No acute distress. HEENT:  AT, moist mucus membranes.  Trachea midline, no masses. Cardiovascular: No clubbing, cyanosis, or edema. Respiratory: Normal respiratory effort, no increased work of breathing. GI: Abdomen is soft, nontender, nondistended, no abdominal masses GU: No CVA tenderness. Circumcised phallus. No masses/lesions on penis, testis, scrotum. Prostate 60g smooth no nodules no induration.  Lymph: No cervical or inguinal lymphadenopathy. Skin: No rashes, bruises or suspicious lesions. Neurologic: Grossly intact, no focal deficits, moving all 4  extremities. Psychiatric: Normal mood and affect.  Laboratory Data: Lab Results  Component Value Date   WBC 5.7 01/18/2008   HGB 13.8 01/18/2008   HCT 39.8 01/18/2008   MCV 92.6 01/18/2008   PLT 159 01/18/2008    Lab Results  Component Value Date   CREATININE 0.88 01/18/2008    No results found for: PSA  No results found for: TESTOSTERONE  No results found for: HGBA1C  Urinalysis    Component Value Date/Time   BILIRUBINUR neg 08/04/2019 1340   PROTEINUR Positive (A) 08/04/2019 1340   UROBILINOGEN 0.2 08/04/2019 1340   NITRITE neg 08/04/2019 1340   LEUKOCYTESUR Negative 08/04/2019 1340    No results found for: LABMICR, WBCUA, RBCUA, LABEPIT, MUCUS, BACTERIA  Pertinent Imaging:  No results found for this or any previous visit. No results found for this or any previous visit. No results found for this or any previous visit. No results found for this or any previous visit. No results found for this or any previous visit. No results found for this or any previous visit. No results found for this or any previous visit. No results found for this or any previous visit.  Assessment & Plan:    1. Elevated PSA -We discussed the natural hx of elevated PSA. Since the patient's PSA has been stable for several years we have elected to proceed with surveillance. RTC 6 months with PSA - POCT urinalysis dipstick  2. Nephrolithiasis -We discussed the management of kidney stones. These options include observation, ureteroscopy, shockwave lithotripsy (ESWL) and percutaneous nephrolithotomy (PCNL). We discussed which options are relevant to the patient's stone(s). We discussed the natural history of kidney stones as well as the complications of untreated stones and the impact on quality of life without treatment as well as with each of the above listed treatments. We also discussed the efficacy of each treatment in its ability to clear the stone burden. With any of these management  options I discussed the signs and symptoms of infection and the need for emergent treatment should these be experienced. For each option we discussed the ability of each procedure to clear the patient of their stone burden.   For observation I described the risks which include but are not limited to silent renal damage, life-threatening infection, need for emergent surgery, failure to pass stone and pain.   For ureteroscopy I described the risks which include bleeding, infection, damage to contiguous structures, positioning injury, ureteral stricture, ureteral avulsion, ureteral injury, need for prolonged ureteral stent, inability to perform ureteroscopy, need for an interval procedure, inability to clear stone burden, stent discomfort/pain, heart attack, stroke, pulmonary embolus and the inherent risks with general anesthesia.   For shockwave lithotripsy I described the risks which include arrhythmia, kidney contusion, kidney  hemorrhage, need for transfusion, pain, inability to adequately break up stone, inability to pass stone fragments, Steinstrasse, infection associated with obstructing stones, need for alternate surgical procedure, need for repeat shockwave lithotripsy, MI, CVA, PE and the inherent risks with anesthesia/conscious sedation.   For PCNL I described the risks including positioning injury, pneumothorax, hydrothorax, need for chest tube, inability to clear stone burden, renal laceration, arterial venous fistula or malformation, need for embolization of kidney, loss of kidney or renal function, need for repeat procedure, need for prolonged nephrostomy tube, ureteral avulsion, MI, CVA, PE and the inherent risks of general anesthesia.   - The patient would like to proceed with medical expulsive therapy. rx for flomax given    No follow-ups on file.  Nicolette Bang, MD  [facility] [place]

## 2019-08-05 DIAGNOSIS — J0101 Acute recurrent maxillary sinusitis: Secondary | ICD-10-CM | POA: Diagnosis not present

## 2019-08-06 ENCOUNTER — Encounter (INDEPENDENT_AMBULATORY_CARE_PROVIDER_SITE_OTHER): Payer: Medicare PPO | Admitting: Ophthalmology

## 2019-08-06 DIAGNOSIS — H35372 Puckering of macula, left eye: Secondary | ICD-10-CM | POA: Diagnosis not present

## 2019-08-06 DIAGNOSIS — I10 Essential (primary) hypertension: Secondary | ICD-10-CM

## 2019-08-06 DIAGNOSIS — H35033 Hypertensive retinopathy, bilateral: Secondary | ICD-10-CM | POA: Diagnosis not present

## 2019-08-06 DIAGNOSIS — D3131 Benign neoplasm of right choroid: Secondary | ICD-10-CM | POA: Diagnosis not present

## 2019-08-06 DIAGNOSIS — H43813 Vitreous degeneration, bilateral: Secondary | ICD-10-CM | POA: Diagnosis not present

## 2019-08-08 ENCOUNTER — Telehealth: Payer: Self-pay | Admitting: Urology

## 2019-08-08 ENCOUNTER — Other Ambulatory Visit: Payer: Self-pay

## 2019-08-08 DIAGNOSIS — N200 Calculus of kidney: Secondary | ICD-10-CM

## 2019-08-08 DIAGNOSIS — R972 Elevated prostate specific antigen [PSA]: Secondary | ICD-10-CM

## 2019-08-08 MED ORDER — TAMSULOSIN HCL 0.4 MG PO CAPS: 0.4000 mg | ORAL_CAPSULE | Freq: Every day | ORAL | 3 refills | Status: DC

## 2019-08-08 NOTE — Telephone Encounter (Signed)
Wife notified of rx sent in.

## 2019-08-08 NOTE — Telephone Encounter (Signed)
Pts wife states [facility] did not received the  tamsulosin prescription.

## 2019-08-11 ENCOUNTER — Ambulatory Visit: Payer: Medicare PPO | Admitting: Urology

## 2019-08-18 ENCOUNTER — Ambulatory Visit: Payer: Medicare PPO | Admitting: Urology

## 2019-08-18 ENCOUNTER — Other Ambulatory Visit: Payer: Self-pay

## 2019-08-18 ENCOUNTER — Encounter: Payer: Self-pay | Admitting: Urology

## 2019-08-18 VITALS — BP 114/58 | HR 90 | Temp 97.2°F | Ht 68.0 in | Wt 172.0 lb

## 2019-08-18 DIAGNOSIS — N200 Calculus of kidney: Secondary | ICD-10-CM | POA: Diagnosis not present

## 2019-08-18 NOTE — Progress Notes (Signed)
Urological Symptom Review  Patient is experiencing the following symptoms: Frequent urination Get up at night to urinate Kidney stone?  Review of Systems  Gastrointestinal (upper)  : Negative for upper GI symptoms  Gastrointestinal (lower) : Negative for lower GI symptoms  Constitutional : Negative for symptoms  Skin: Negative for skin symptoms  Eyes: Negative for eye symptoms  Ear/Nose/Throat : Sinus problems  Hematologic/Lymphatic: Negative for Hematologic/Lymphatic symptoms  Cardiovascular : Negative for cardiovascular symptoms  Respiratory : Negative for respiratory symptoms  Endocrine: Negative for endocrine symptoms  Musculoskeletal: Joint pain  Neurological: Headaches Dizziness  Psychologic: Depression Anxiety

## 2019-08-18 NOTE — Progress Notes (Signed)
08/18/2019 2:11 PM   Chris [name]. [birth year] [id number]  Referring provider: Rory Percy, MD [place],  [place] [address]  nephrolithiasis  HPI: [name] is a 74yo here for followup for nephrolithiasis. He believes he passed his stone over 1 week ago. NO current flank pain. No LUTS. No hematuria. No fevers/chills/sweats   PMH: Past Medical History:  Diagnosis Date  . Anxiety   . Chest pain, unspecified   . Depression   . Dyslexia   . Hearing loss    Attributed to recurring sinus infections  . Seasonal allergies   . Stroke [facility])    Right lateral temporal lobe  . Unspecified essential hypertension     Surgical History: Past Surgical History:  Procedure Laterality Date  . HIP ARTHROPLASTY    . TOTAL KNEE ARTHROPLASTY      Home Medications:  Allergies as of 08/18/2019   No Known Allergies     Medication List       Accurate as of Aug 18, 2019  2:11 PM. If you have any questions, ask your nurse or doctor.        acetaminophen 325 MG tablet Commonly known as: TYLENOL Take 650 mg by mouth every 6 (six) hours as needed.   BC HEADACHE PO Take by mouth.   busPIRone 15 MG tablet Commonly known as: BUSPAR Take 15 mg by mouth 2 (two) times daily.   clonazePAM 1 MG tablet Commonly known as: KLONOPIN Take 1 mg by mouth 2 (two) times daily.   desvenlafaxine 50 MG 24 hr tablet Commonly known as: PRISTIQ   donepezil 10 MG tablet Commonly known as: ARICEPT Take 1 tablet daily   fluticasone 50 MCG/ACT nasal spray Commonly known as: FLONASE   HYDROcodone-acetaminophen 5-325 MG tablet Commonly known as: NORCO/VICODIN   ibuprofen 200 MG tablet Commonly known as: ADVIL Take 200 mg by mouth every 6 (six) hours as needed.   losartan 25 MG tablet Commonly known as: COZAAR Take 25 mg by mouth daily.   Meclizine HCl 25 MG Chew Chew by mouth.   memantine 10 MG tablet Commonly known as: Namenda Take 1 tablet every night for 2 weeks, then  increase to 1 tablet twice a day   montelukast 10 MG tablet Commonly known as: SINGULAIR   Mucinex Sinus-Max 5-10-200-325 MG Tabs Generic drug: Phenylephrine-DM-GG-APAP Take by mouth.   Multivital tablet Take 1 tablet by mouth daily.   tamsulosin 0.4 MG Caps capsule Commonly known as: FLOMAX Take 1 capsule (0.4 mg total) by mouth daily after supper.   vitamin B-12 100 MCG tablet Commonly known as: CYANOCOBALAMIN Take 100 mcg by mouth daily.       Allergies: No Known Allergies  Family History: Family History  Problem Relation Age of Onset  . Heart disease Mother   . Cancer Father     Social History:  reports that he quit smoking about 50 years ago. He has a 1.00 pack-year smoking history. He has never used smokeless tobacco. He reports that he does not drink alcohol or use drugs.  ROS: All other review of systems were reviewed and are negative except what is noted above in HPI  Physical Exam: BP (!) 114/58   Pulse 90   Temp (!) 97.2 F (36.2 C)   Ht 5\' 8"  (1.727 m)   Wt 172 lb (78 kg)   BMI 26.15 kg/m   Constitutional:  Alert and oriented, No acute distress. HEENT: [place] AT, moist mucus membranes.  Trachea midline, no masses. Cardiovascular: No clubbing, cyanosis, or edema. Respiratory: Normal respiratory effort, no increased work of breathing. GI: Abdomen is soft, nontender, nondistended, no abdominal masses GU: No CVA tenderness.  Lymph: No cervical or inguinal lymphadenopathy. Skin: No rashes, bruises or suspicious lesions. Neurologic: Grossly intact, no focal deficits, moving all 4 extremities. Psychiatric: Normal mood and affect.  Laboratory Data: Lab Results  Component Value Date   WBC 5.7 01/18/2008   HGB 13.8 01/18/2008   HCT 39.8 01/18/2008   MCV 92.6 01/18/2008   PLT 159 01/18/2008    Lab Results  Component Value Date   CREATININE 0.88 01/18/2008    No results found for: PSA  No results found for: TESTOSTERONE  No results found for:  HGBA1C  Urinalysis    Component Value Date/Time   BILIRUBINUR neg 08/04/2019 1340   PROTEINUR Positive (A) 08/04/2019 1340   UROBILINOGEN 0.2 08/04/2019 1340   NITRITE neg 08/04/2019 1340   LEUKOCYTESUR Negative 08/04/2019 1340    No results found for: LABMICR, WBCUA, RBCUA, LABEPIT, MUCUS, BACTERIA  Pertinent Imaging:  No results found for this or any previous visit. No results found for this or any previous visit. No results found for this or any previous visit. No results found for this or any previous visit. No results found for this or any previous visit. No results found for this or any previous visit. No results found for this or any previous visit. No results found for this or any previous visit.  Assessment & Plan:    1. Nephrolithiasis -RTC 6 months with renal US   Return in about 6 months (around 02/18/2020) for PSA.  Nicolette Bang, MD  [facility] [place]

## 2019-08-18 NOTE — Patient Instructions (Signed)

## 2019-08-25 ENCOUNTER — Ambulatory Visit: Payer: Medicare PPO | Admitting: Urology

## 2019-08-29 DIAGNOSIS — J310 Chronic rhinitis: Secondary | ICD-10-CM | POA: Diagnosis not present

## 2019-08-29 DIAGNOSIS — J343 Hypertrophy of nasal turbinates: Secondary | ICD-10-CM | POA: Diagnosis not present

## 2019-08-29 DIAGNOSIS — J342 Deviated nasal septum: Secondary | ICD-10-CM | POA: Diagnosis not present

## 2019-08-29 DIAGNOSIS — H903 Sensorineural hearing loss, bilateral: Secondary | ICD-10-CM | POA: Diagnosis not present

## 2019-09-04 ENCOUNTER — Other Ambulatory Visit: Payer: Self-pay | Admitting: Otolaryngology

## 2019-09-04 ENCOUNTER — Other Ambulatory Visit (HOSPITAL_COMMUNITY): Payer: Self-pay | Admitting: Otolaryngology

## 2019-09-04 DIAGNOSIS — J329 Chronic sinusitis, unspecified: Secondary | ICD-10-CM

## 2019-09-16 DIAGNOSIS — F419 Anxiety disorder, unspecified: Secondary | ICD-10-CM | POA: Diagnosis not present

## 2019-09-16 DIAGNOSIS — F332 Major depressive disorder, recurrent severe without psychotic features: Secondary | ICD-10-CM | POA: Diagnosis not present

## 2019-09-16 DIAGNOSIS — G3184 Mild cognitive impairment, so stated: Secondary | ICD-10-CM | POA: Diagnosis not present

## 2019-09-18 ENCOUNTER — Other Ambulatory Visit: Payer: Self-pay

## 2019-09-18 ENCOUNTER — Ambulatory Visit (HOSPITAL_COMMUNITY)
Admission: RE | Admit: 2019-09-18 | Discharge: 2019-09-18 | Disposition: A | Discharge status: Home / Self Care | Payer: Medicare PPO | Source: Ambulatory Visit | Attending: Otolaryngology | Admitting: Otolaryngology

## 2019-09-18 DIAGNOSIS — J342 Deviated nasal septum: Secondary | ICD-10-CM | POA: Diagnosis not present

## 2019-09-18 DIAGNOSIS — J329 Chronic sinusitis, unspecified: Secondary | ICD-10-CM | POA: Diagnosis not present

## 2019-09-26 DIAGNOSIS — J310 Chronic rhinitis: Secondary | ICD-10-CM | POA: Diagnosis not present

## 2019-09-26 DIAGNOSIS — J343 Hypertrophy of nasal turbinates: Secondary | ICD-10-CM | POA: Diagnosis not present

## 2019-09-26 DIAGNOSIS — J342 Deviated nasal septum: Secondary | ICD-10-CM | POA: Diagnosis not present

## 2019-10-16 DIAGNOSIS — F419 Anxiety disorder, unspecified: Secondary | ICD-10-CM | POA: Diagnosis not present

## 2019-10-16 DIAGNOSIS — G3184 Mild cognitive impairment, so stated: Secondary | ICD-10-CM | POA: Diagnosis not present

## 2019-10-16 DIAGNOSIS — F332 Major depressive disorder, recurrent severe without psychotic features: Secondary | ICD-10-CM | POA: Diagnosis not present

## 2019-10-31 DIAGNOSIS — H9041 Sensorineural hearing loss, unilateral, right ear, with unrestricted hearing on the contralateral side: Secondary | ICD-10-CM | POA: Diagnosis not present

## 2019-11-26 ENCOUNTER — Other Ambulatory Visit: Payer: Self-pay | Admitting: Urology

## 2019-11-26 DIAGNOSIS — N200 Calculus of kidney: Secondary | ICD-10-CM

## 2019-12-15 DIAGNOSIS — F332 Major depressive disorder, recurrent severe without psychotic features: Secondary | ICD-10-CM | POA: Diagnosis not present

## 2019-12-15 DIAGNOSIS — G3184 Mild cognitive impairment, so stated: Secondary | ICD-10-CM | POA: Diagnosis not present

## 2019-12-15 DIAGNOSIS — F419 Anxiety disorder, unspecified: Secondary | ICD-10-CM | POA: Diagnosis not present

## 2020-01-05 DIAGNOSIS — I10 Essential (primary) hypertension: Secondary | ICD-10-CM | POA: Diagnosis not present

## 2020-01-05 DIAGNOSIS — F411 Generalized anxiety disorder: Secondary | ICD-10-CM | POA: Diagnosis not present

## 2020-01-05 DIAGNOSIS — R42 Dizziness and giddiness: Secondary | ICD-10-CM | POA: Diagnosis not present

## 2020-01-05 DIAGNOSIS — Z23 Encounter for immunization: Secondary | ICD-10-CM | POA: Diagnosis not present

## 2020-01-05 DIAGNOSIS — Z6827 Body mass index (BMI) 27.0-27.9, adult: Secondary | ICD-10-CM | POA: Diagnosis not present

## 2020-01-05 DIAGNOSIS — Z1331 Encounter for screening for depression: Secondary | ICD-10-CM | POA: Diagnosis not present

## 2020-01-05 DIAGNOSIS — Z0001 Encounter for general adult medical examination with abnormal findings: Secondary | ICD-10-CM | POA: Diagnosis not present

## 2020-01-05 DIAGNOSIS — J0101 Acute recurrent maxillary sinusitis: Secondary | ICD-10-CM | POA: Diagnosis not present

## 2020-01-05 DIAGNOSIS — Z1389 Encounter for screening for other disorder: Secondary | ICD-10-CM | POA: Diagnosis not present

## 2020-01-05 DIAGNOSIS — F0390 Unspecified dementia without behavioral disturbance: Secondary | ICD-10-CM | POA: Diagnosis not present

## 2020-01-22 ENCOUNTER — Other Ambulatory Visit: Payer: Self-pay

## 2020-01-22 ENCOUNTER — Ambulatory Visit (HOSPITAL_COMMUNITY)
Admission: RE | Admit: 2020-01-22 | Discharge: 2020-01-22 | Disposition: A | Discharge status: Home / Self Care | Payer: Medicare PPO | Source: Ambulatory Visit | Attending: Urology | Admitting: Urology

## 2020-01-22 DIAGNOSIS — N200 Calculus of kidney: Secondary | ICD-10-CM | POA: Insufficient documentation

## 2020-02-02 NOTE — Progress Notes (Signed)
Sent via mychart

## 2020-02-04 ENCOUNTER — Encounter: Payer: Self-pay | Admitting: Urology

## 2020-02-04 ENCOUNTER — Other Ambulatory Visit: Payer: Self-pay

## 2020-02-04 ENCOUNTER — Ambulatory Visit (INDEPENDENT_AMBULATORY_CARE_PROVIDER_SITE_OTHER): Payer: Medicare PPO | Admitting: Urology

## 2020-02-04 VITALS — BP 116/75 | HR 74 | Temp 98.3°F | Ht 68.0 in | Wt 172.0 lb

## 2020-02-04 DIAGNOSIS — R972 Elevated prostate specific antigen [PSA]: Secondary | ICD-10-CM | POA: Diagnosis not present

## 2020-02-04 DIAGNOSIS — N200 Calculus of kidney: Secondary | ICD-10-CM | POA: Diagnosis not present

## 2020-02-04 LAB — URINALYSIS, ROUTINE W REFLEX MICROSCOPIC
Bilirubin, UA: NEGATIVE
Glucose, UA: NEGATIVE
Ketones, UA: NEGATIVE
Leukocytes,UA: NEGATIVE
Nitrite, UA: NEGATIVE
RBC, UA: NEGATIVE
Specific Gravity, UA: >1.03 — ABNORMAL HIGH (ref 1.005–1.030)
Urobilinogen, Ur: 0.2 mg/dL (ref 0.2–1.0)
pH, UA: 5 (ref 5.0–7.5)

## 2020-02-04 LAB — MICROSCOPIC EXAMINATION
Bacteria, UA: NONE SEEN
RBC, Urine: NONE SEEN /hpf (ref 0–2)
Renal Epithel, UA: NONE SEEN /hpf
WBC, UA: NONE SEEN /hpf (ref 0–5)

## 2020-02-04 LAB — BLADDER SCAN AMB NON-IMAGING: Scan Result: 2

## 2020-02-04 MED ORDER — MIRABEGRON ER 25 MG PO TB24: 25.0000 mg | ORAL_TABLET | Freq: Every day | ORAL | 0 refills | Status: DC

## 2020-02-04 NOTE — Progress Notes (Signed)
PVR=51ml   Urological Symptom Review  Patient is experiencing the following symptoms: Frequent urination Hard to postpone urination Get up at night to urinate Leakage of urine Stream starts and stops Trouble starting stream Erection problems (male only)   Review of Systems  Gastrointestinal (upper)  : Negative for upper GI symptoms  Gastrointestinal (lower) : Negative for lower GI symptoms  Constitutional : Negative for symptoms  Skin: Negative for skin symptoms  Eyes: Negative for eye symptoms  Ear/Nose/Throat : Sinus problems  Hematologic/Lymphatic: Negative for Hematologic/Lymphatic symptoms  Cardiovascular : Negative for cardiovascular symptoms  Respiratory : Negative for respiratory symptoms  Endocrine: Negative for endocrine symptoms  Musculoskeletal: Joint pain  Neurological: Headaches Dizziness  Psychologic: Depression Anxiety

## 2020-02-04 NOTE — Progress Notes (Signed)
02/04/2020 2:15 PM   Chris [name]. 1946/01/29 [id number]  Referring provider: Rory Percy, MD [place],  [place] [address]  followup nephrolithiasis  HPI: Chris [name] is a 74yo here for followup for nephrolithiasis and elevated PSA. No recent PSA. No worsening LUTS. No hematuria or dysuria. No stone events since last visit. No flank pain. Renal US shows no calculi or hydronephrosis   PMH: Past Medical History:  Diagnosis Date  . Anxiety   . Chest pain, unspecified   . Depression   . Dyslexia   . Hearing loss    Attributed to recurring sinus infections  . Seasonal allergies   . Stroke [facility])    Right lateral temporal lobe  . Unspecified essential hypertension     Surgical History: Past Surgical History:  Procedure Laterality Date  . HIP ARTHROPLASTY    . TOTAL KNEE ARTHROPLASTY      Home Medications:  Allergies as of 02/04/2020   No Known Allergies     Medication List       Accurate as of February 04, 2020  2:15 PM. If you have any questions, ask your nurse or doctor.        STOP taking these medications   HYDROcodone-acetaminophen 5-325 MG tablet Commonly known as: NORCO/VICODIN Stopped by: Nicolette Bang, MD     TAKE these medications   acetaminophen 325 MG tablet Commonly known as: TYLENOL Take 650 mg by mouth every 6 (six) hours as needed.   BC HEADACHE PO Take by mouth.   buPROPion 150 MG 24 hr tablet Commonly known as: WELLBUTRIN XL   busPIRone 15 MG tablet Commonly known as: BUSPAR Take 15 mg by mouth 2 (two) times daily.   clonazePAM 1 MG tablet Commonly known as: KLONOPIN Take 1 mg by mouth daily.   desvenlafaxine 100 MG 24 hr tablet Commonly known as: PRISTIQ What changed: Another medication with the same name was removed. Continue taking this medication, and follow the directions you see here. Changed by: Nicolette Bang, MD   donepezil 10 MG tablet Commonly known as: ARICEPT Take 1 tablet daily   fluticasone  50 MCG/ACT nasal spray Commonly known as: FLONASE   ibuprofen 200 MG tablet Commonly known as: ADVIL Take 200 mg by mouth every 6 (six) hours as needed.   losartan 25 MG tablet Commonly known as: COZAAR Take 25 mg by mouth daily.   Meclizine HCl 25 MG Chew Chew by mouth.   memantine 10 MG tablet Commonly known as: Namenda Take 1 tablet every night for 2 weeks, then increase to 1 tablet twice a day   montelukast 10 MG tablet Commonly known as: SINGULAIR   Mucinex Sinus-Max 5-10-200-325 MG Tabs Generic drug: Phenylephrine-DM-GG-APAP Take by mouth.   Multivital tablet Take 1 tablet by mouth daily.   tamsulosin 0.4 MG Caps capsule Commonly known as: FLOMAX TAKE 1 CAPSULE BY MOUTH EVERY DAY AFTER SUPPER   vitamin B-12 100 MCG tablet Commonly known as: CYANOCOBALAMIN Take 100 mcg by mouth daily.       Allergies: No Known Allergies  Family History: Family History  Problem Relation Age of Onset  . Heart disease Mother   . Cancer Father     Social History:  reports that he quit smoking about 50 years ago. He has a 1.00 pack-year smoking history. He has never used smokeless tobacco. He reports that he does not drink alcohol and does not use drugs.  ROS: All other review of systems were  reviewed and are negative except what is noted above in HPI  Physical Exam: BP 116/75   Pulse 74   Temp 98.3 F (36.8 C)   Ht 5\' 8"  (1.727 m)   Wt 172 lb (78 kg)   BMI 26.15 kg/m   Constitutional:  Alert and oriented, No acute distress. HEENT: [place] AT, moist mucus membranes.  Trachea midline, no masses. Cardiovascular: No clubbing, cyanosis, or edema. Respiratory: Normal respiratory effort, no increased work of breathing. GI: Abdomen is soft, nontender, nondistended, no abdominal masses GU: No CVA tenderness.  Lymph: No cervical or inguinal lymphadenopathy. Skin: No rashes, bruises or suspicious lesions. Neurologic: Grossly intact, no focal deficits, moving all 4  extremities. Psychiatric: Normal mood and affect.  Laboratory Data: Lab Results  Component Value Date   WBC 5.7 01/18/2008   HGB 13.8 01/18/2008   HCT 39.8 01/18/2008   MCV 92.6 01/18/2008   PLT 159 01/18/2008    Lab Results  Component Value Date   CREATININE 0.88 01/18/2008    No results found for: PSA  No results found for: TESTOSTERONE  No results found for: HGBA1C  Urinalysis    Component Value Date/Time   APPEARANCEUR Clear 02/04/2020 1327   GLUCOSEU Negative 02/04/2020 1327   BILIRUBINUR Negative 02/04/2020 1327   PROTEINUR Trace (A) 02/04/2020 1327   UROBILINOGEN 0.2 08/04/2019 1340   NITRITE Negative 02/04/2020 1327   LEUKOCYTESUR Negative 02/04/2020 1327    Lab Results  Component Value Date   LABMICR See below: 02/04/2020   WBCUA None seen 02/04/2020   LABEPIT 0-10 02/04/2020   MUCUS Present 02/04/2020   BACTERIA None seen 02/04/2020    Pertinent Imaging: Renal US 01/22/2020: Images reviewed and discussed with patient  No results found for this or any previous visit.  No results found for this or any previous visit.  No results found for this or any previous visit.  No results found for this or any previous visit.  Results for orders placed during the hospital encounter of 01/22/20  Ultrasound renal complete  Narrative CLINICAL DATA:  Nephrolithiasis  EXAM: RENAL / URINARY TRACT ULTRASOUND COMPLETE  COMPARISON:  CT abdomen and pelvis 07/27/2019  FINDINGS: Right Kidney:  Renal measurements: 9.9 x 5.5 x 5.1 cm = volume: 145 mL. Normal cortical thickness. Upper normal cortical echogenicity. No mass, hydronephrosis or shadowing calcification.  Left Kidney:  Renal measurements: 10.8 x 5.5 x 4.4 cm = volume: 137 mL. Cortical thinning. Normal cortical echogenicity. No mass, hydronephrosis, or shadowing calcification.  Bladder:  Appears normal for degree of bladder distention. RIGHT ureteral jet was visualized.  Other:  No  postvoid residual within urinary bladder.  IMPRESSION: No renal sonographic abnormalities.   Electronically Signed By: Lavonia Dana M.D. On: 01/22/2020 15:06  No results found for this or any previous visit.  No results found for this or any previous visit.  No results found for this or any previous visit.   Assessment & Plan:    1. Nephrolithiasis RTC 1 year with Renal US - Urinalysis, Routine w reflex microscopic - BLADDER SCAN AMB NON-IMAGING  2. Elevated PSA -PSA today   No follow-ups on file.  Nicolette Bang, MD  [facility] [place]

## 2020-02-05 LAB — PSA: Prostate Specific Ag, Serum: 5.1 ng/mL — ABNORMAL HIGH (ref 0.0–4.0)

## 2020-02-09 ENCOUNTER — Other Ambulatory Visit: Payer: Self-pay

## 2020-02-09 ENCOUNTER — Encounter: Payer: Self-pay | Admitting: Neurology

## 2020-02-09 ENCOUNTER — Ambulatory Visit: Payer: Medicare PPO | Admitting: Neurology

## 2020-02-09 VITALS — BP 122/85 | HR 65 | Ht 68.0 in | Wt 174.4 lb

## 2020-02-09 DIAGNOSIS — F0390 Unspecified dementia without behavioral disturbance: Secondary | ICD-10-CM

## 2020-02-09 DIAGNOSIS — R519 Headache, unspecified: Secondary | ICD-10-CM | POA: Diagnosis not present

## 2020-02-09 DIAGNOSIS — F03A0 Unspecified dementia, mild, without behavioral disturbance, psychotic disturbance, mood disturbance, and anxiety: Secondary | ICD-10-CM

## 2020-02-09 MED ORDER — GABAPENTIN 100 MG PO CAPS: ORAL_CAPSULE | ORAL | 11 refills | Status: DC

## 2020-02-09 MED ORDER — DONEPEZIL HCL 10 MG PO TABS: ORAL_TABLET | ORAL | 3 refills | Status: DC

## 2020-02-09 MED ORDER — MEMANTINE HCL 10 MG PO TABS
ORAL_TABLET | ORAL | 11 refills | Status: DC
Start: 2020-02-09 — End: 2020-09-10

## 2020-02-09 NOTE — Progress Notes (Signed)
NEUROLOGY FOLLOW UP OFFICE NOTE  Chris [name] [id number] 11/11/1945  HISTORY OF PRESENT ILLNESS: I had the pleasure of seeing Chris [name] in follow-up in the neurology clinic on 02/09/2020.  The patient was last seen 7 months ago for dementia. He is again accompanied by his wife who helps supplement the history today. On his last visit, his wife reported continued decline and Memantine 10mg  BID was added to Donepezil 10mg  daily. He does not drive any more. His wife administers medications. He is independent with dressing and bathing. His main concern today are daily headaches. He feels bad all day, worse at night. He describes pressure over the frontal region, radiating diffusely. No nausea/vomiting, visual obscurations. He cannot think very well, he does not know friends' names. He feels bad all the time, he has a hard time walking due to dizziness. When he stands, it feels like his head is going up and down, with more pressure around his ears and nose. Pressure has worsened the past 2-3 weeks. He also reports sore throat and a little neck pain. He has seen his PCP and ENT and has been diagnosed with chronic sinusitis. He takes Mucinex sinus twice a day. Sometimes he takes 2 Benadryl which helps a little. He also takes Tylenol and Ibuprofen for body pains. He reports bad dreams at night, he gets 8-9 hours of sleep.    History on Initial Assessment 05/30/2018: This is a 74 year old right-handed man with a history of diet-controlled hypertension, depression, anxiety, presenting for evaluation of worsening memory. He states his memory is good and bad at certain things, she cannot recall what he did yesterday, he would not recall the date and have to look at his calendar. He denies getting lost driving. His wife occasionally reminds him to take his medications. He has frequent headaches and wants to take over the counter medication all day long. His wife manages bills. His wife states he has never  been one to remember names, but in the past 6 months, she has noticed he would have difficulty thinking of the name of an object and get dates and times confused. He would ask her 5 times in one day what time his doctor appointment was. She denies any significant driving concerns. She has noticed that he gets really nervous and shaky around 2pm, then calms down by 8pm. Sleep is good, no wandering behavior. He still feels drowsy during the day. No paranoia or hallucinations. They report he had failed several grades in school, joined the [employer], then went to [employer] where he had a GPA of 3.85. It was in college where he was formally diagnosed with dyslexia. 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 There is no family history of dementia. No history of significant head injuries. He drinks 1 big can of beer a week, sometimes he drinks 3-4 times a week.   He has had daily headaches for several years and saw neurologist Dr. Domingo Cocking in 2018. He had trigger point injections and became headache-free for a while, until he had a fall with loss of consciousness last September 2019 and headaches recurred. He has a moderate ache in the frontal region constantly, no nausea/vomiting, photo/phonophobia. He has been taking Tylenol and BC powders but states he does not take it quite everyday. He has dizziness with lightheadedness sometimes lasting all day until he takes meclizine which helps. He denies any diplopia, dysarthria/dysphagia, back pain, bladder dysfunction, anosmia. He has occasional neck  pain and constipation. He started noticing bilateral hand tremors 2-3 years ago which do not affect writing or using utensils. He fell while walking on a trail last September, he recalls starting feeling bad in his stomach and started walking faster, then passed out. He feels he was only out for a second, he does not remember hitting the ground but stood up and and realized he had fallen down. He had  significant injury on the left eyelid and left side of his face and underwent plastic surgery. He still has stitches on the left lateral eyelid. He has occasional numbness and tingling in his right hand, it feels cold sometimes. He has noticed difficulty buttoning or tying shoelaces with his right hand. His wife has noticed shuffling when walking over the past year.  I personally reviewed MRI brain without contrast done 11/27/2016 which did not show any acute changes. There was scattered foci of susceptibility are present over the posterior left temporal lobe and occipital lobe, increased dural vasculature is suggested on axial image 8 of series 7. 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 There was moderate diffuse atrophy and mild chronic microvascular disease  I personally reviewed MRI brain with and without contrast done February 2020 which did not show any acute changes.There was stable small chronic infarct within the right lateral temporal lobe, stable mild chronic microvascular disease, moderate diffuse volume loss with prominent volume loss in the anteromedial temporal lobes and parahippocampal gyri (which can be seen with Alzheimer's or frontotemporal lobar degeneration). There were numerous foci of chronic microhemorrhages predominantly in the posterior distribution, greater on the left (favoring amyloid angiopathy).   Neuropsychological testing in 02/2019 showed Major Neurocognitive Disorder (dementia), mild end of spectrum. Etiology likely multifactorial, vascular and Alzheimer's disease.   TSH and B12 unremarkable.    PAST MEDICAL HISTORY: Past Medical History:  Diagnosis Date  . Anxiety   . Chest pain, unspecified   . Depression   . Dyslexia   . Hearing loss    Attributed to recurring sinus infections  . Seasonal allergies   . Stroke [facility])    Right lateral temporal lobe  . Unspecified essential hypertension     MEDICATIONS: Current Outpatient Medications on File  Prior to Visit  Medication Sig Dispense Refill  . acetaminophen (TYLENOL) 325 MG tablet Take 650 mg by mouth every 6 (six) hours as needed.    . Aspirin-Salicylamide-Caffeine (BC HEADACHE PO) Take by mouth.    Marland Kitchen buPROPion (WELLBUTRIN XL) 150 MG 24 hr tablet     . busPIRone (BUSPAR) 15 MG tablet Take 15 mg by mouth 3 (three) times daily.     . clonazePAM (KLONOPIN) 1 MG tablet Take 1 mg by mouth daily.     Marland Kitchen desvenlafaxine (PRISTIQ) 100 MG 24 hr tablet     . donepezil (ARICEPT) 10 MG tablet Take 1 tablet daily 90 tablet 3  . fluticasone (FLONASE) 50 MCG/ACT nasal spray     . ibuprofen (ADVIL) 200 MG tablet Take 200 mg by mouth every 6 (six) hours as needed.    Marland Kitchen losartan (COZAAR) 25 MG tablet Take 25 mg by mouth daily.    . Meclizine HCl 25 MG CHEW Chew by mouth.    . Melatonin 10 MG CAPS Take 20 mg by mouth at bedtime.    . memantine (NAMENDA) 10 MG tablet Take 1 tablet every night for 2 weeks, then increase to 1 tablet twice a day 60 tablet 11  . mirabegron ER (MYRBETRIQ)  25 MG TB24 tablet Take 1 tablet (25 mg total) by mouth daily. 30 tablet 0  . montelukast (SINGULAIR) 10 MG tablet     . Multiple Vitamins-Minerals (MULTIVITAL) tablet Take 1 tablet by mouth daily.      Marland Kitchen Phenylephrine-DM-GG-APAP (MUCINEX SINUS-MAX) 5-10-200-325 MG TABS Take by mouth.    . vitamin B-12 (CYANOCOBALAMIN) 100 MCG tablet Take 100 mcg by mouth daily.    . tamsulosin (FLOMAX) 0.4 MG CAPS capsule TAKE 1 CAPSULE BY MOUTH EVERY DAY AFTER SUPPER (Patient not taking: Reported on 02/09/2020) 30 capsule 3   No current facility-administered medications on file prior to visit.    ALLERGIES: No Known Allergies  FAMILY HISTORY: Family History  Problem Relation Age of Onset  . Heart disease Mother   . Cancer Father     SOCIAL HISTORY: Social History   Socioeconomic History  . Marital status: Married    Spouse name: Not on file  . Number of children: 2  . Years of education: 16  . Highest education level:  Bachelor's degree (e.g., BA, AB, BS)  Occupational History  . Not on file  Tobacco Use  . Smoking status: Former Smoker    Packs/day: 0.50    Years: 2.00    Pack years: 1.00    Quit date: 08/03/1969    Years since quitting: 50.5  . Smokeless tobacco: Never Used  Vaping Use  . Vaping Use: Never used  Substance and Sexual Activity  . Alcohol use: No  . Drug use: No  . Sexual activity: Yes  Other Topics Concern  . 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 Difficulty of Paying Living Expenses: Not on file  Food Insecurity:   . Worried About Charity fundraiser in the Last Year: Not on file  . Ran Out of Food in the Last Year: Not on file  Transportation Needs:   . Lack of Transportation (Medical): Not on file  . Lack of Transportation (Non-Medical): Not on file  Physical Activity:   . Days of Exercise per Week: Not on file  . Minutes of Exercise per Session: Not on file  Stress:   . Feeling of Stress : Not on file  Social Connections:   . Frequency of Communication with Friends and Family: Not on file  . Frequency of Social Gatherings with Friends and Family: Not on file  . Attends Religious Services: Not on file  . Active Member of Clubs or Organizations: Not on file  . Attends Archivist Meetings: Not on file  . Marital Status: Not on file  Intimate Partner Violence:   . Fear of Current or Ex-Partner: Not on file  . Emotionally Abused: Not on file  . Physically Abused: Not on file  . Sexually Abused: Not on file     PHYSICAL EXAM: Vitals:   02/09/20 1507  BP: 122/85  Pulse: 65  SpO2: 97%   General: No acute distress Head:  Normocephalic/atraumatic Skin/Extremities: No rash, no  edema Neurological Exam: alert and oriented to person, place, and time. No aphasia or dysarthria. Fund of knowledge is appropriate.  Recent and remote memory are impaired.  Attention and concentration  are normal.   Cranial nerves: Pupils equal, round. Extraocular movements intact with no nystagmus. Visual fields full.  No facial asymmetry.  Motor: Bulk and tone normal, muscle strength 5/5 throughout with no pronator drift.   Finger to nose testing intact.  Gait slow and cautious, no ataxia   IMPRESSION: This is a 74 yo RH man with a history of diet-controlled hypertension, depression, anxiety, and mild dementia. Neuropsychological testing in 02/2019 indicated Major Neurocognitive Disorder (ie dementia), mild, likely multifactorial, vascular and Alzheimer's disease. MRI brain no acute changes, there was note of temporal lobe atrophy as well as chronic microhemorrhages. He is having bad dreams and will try taking Donepezil 10mg  daily in the morning. May hold in the future to assess if any improvement. Continue Memantine 10mg  BID. His main concern today are the chronic daily headaches. He has been working with his PCP and ENT with no improvement, we discussed a trial of gabapentin 100mg  qhs for symptomatic treatment. Side effects discussed, we may uptitrate as tolerated. He will call for an update in a month. Continue close supervision, he does not drive. Follow-up in 6 months, they know to call for any changes.   Thank you for allowing me to participate in his care.  Please do not hesitate to call for any questions or concerns.   Ellouise Newer, M.D.   CC: Dr. Nadara Mustard

## 2020-02-09 NOTE — Patient Instructions (Signed)
1. Start Gabapentin 100mg : Take 1 capsule every night. Update our office in a month, we can increase dose as needed  2. Start taking the Donepezil 10mg  tablet every morning and see if this helps with the bad dreams.   3. Continue Memantine 10mg  twice a day  4. Follow-up in 6 months, call for any changes   FALL PRECAUTIONS: Be cautious when walking. Scan the area for obstacles that may increase the risk of trips and falls. When getting up in the mornings, sit up at the edge of the bed for a few minutes before getting out of bed. Consider elevating the bed at the head end to avoid drop of blood pressure when getting up. Walk always in a well-lit room (use night lights in the walls). Avoid area rugs or power cords from appliances in the middle of the walkways. Use a walker or a cane if necessary and consider physical therapy for balance exercise. Get your eyesight checked regularly.  FINANCIAL OVERSIGHT: Supervision, especially oversight when making financial decisions or transactions is also recommended.  HOME SAFETY: Consider the safety of the kitchen when operating appliances like stoves, microwave oven, and blender. Consider having supervision and share cooking responsibilities until no longer able to participate in those. Accidents with firearms and other hazards in the house should be identified and addressed as well.   ABILITY TO BE LEFT ALONE: If patient is unable to contact 911 operator, consider using LifeLine, or when the need is there, arrange for someone to stay with patients. Smoking is a fire hazard, consider supervision or cessation. Risk of wandering should be assessed by caregiver and if detected at any point, supervision and safe proof recommendations should be instituted.  MEDICATION SUPERVISION: Inability to self-administer medication needs to be constantly addressed. Implement a mechanism to ensure safe administration of the medications.  RECOMMENDATIONS FOR ALL PATIENTS WITH  MEMORY PROBLEMS: 1. Continue to exercise (Recommend 30 minutes of walking everyday, or 3 hours every week) 2. Increase social interactions - continue going to [place] and enjoy social gatherings with friends and family 3. Eat healthy, avoid fried foods and eat more fruits and vegetables 4. Maintain adequate blood pressure, blood sugar, and blood cholesterol level. Reducing the risk of stroke and cardiovascular disease also helps promoting better memory. 5. Avoid stressful situations. Live a simple life and avoid aggravations. Organize your time and prepare for the next day in anticipation. 6. Sleep well, avoid any interruptions of sleep and avoid any distractions in the bedroom that may interfere with adequate sleep quality 7. Avoid sugar, avoid sweets as there is a strong link between excessive sugar intake, diabetes, and cognitive impairment The Mediterranean diet has been shown to help patients reduce the risk of progressive memory disorders and reduces cardiovascular risk. This includes eating fish, eat fruits and green leafy vegetables, nuts like almonds and hazelnuts, walnuts, and also use olive oil. Avoid fast foods and fried foods as much as possible. Avoid sweets and sugar as sugar use has been linked to worsening of memory function.  There is always a concern of gradual progression of memory problems. If this is the case, then we may need to adjust level of care according to patient needs. Support, both to the patient and caregiver, should then be put into place.

## 2020-02-12 NOTE — Progress Notes (Signed)
Sent via mychart

## 2020-03-04 ENCOUNTER — Ambulatory Visit: Payer: Medicare PPO | Admitting: Urology

## 2020-05-06 ENCOUNTER — Other Ambulatory Visit: Payer: Self-pay

## 2020-05-06 ENCOUNTER — Other Ambulatory Visit: Payer: Medicare PPO

## 2020-05-06 DIAGNOSIS — R972 Elevated prostate specific antigen [PSA]: Secondary | ICD-10-CM

## 2020-05-06 DIAGNOSIS — N200 Calculus of kidney: Secondary | ICD-10-CM

## 2020-05-06 NOTE — Addendum Note (Signed)
Addended by: Iris Pert on: 05/06/2020 11:02 AM   Modules accepted: Orders

## 2020-05-07 LAB — PSA: Prostate Specific Ag, Serum: 14.4 ng/mL — ABNORMAL HIGH (ref 0.0–4.0)

## 2020-05-12 ENCOUNTER — Encounter: Payer: Self-pay | Admitting: Urology

## 2020-05-12 ENCOUNTER — Other Ambulatory Visit: Payer: Self-pay

## 2020-05-12 ENCOUNTER — Ambulatory Visit (INDEPENDENT_AMBULATORY_CARE_PROVIDER_SITE_OTHER): Payer: Medicare PPO | Admitting: Urology

## 2020-05-12 VITALS — BP 136/79 | HR 74 | Temp 98.4°F | Ht 67.5 in | Wt 174.0 lb

## 2020-05-12 DIAGNOSIS — N138 Other obstructive and reflux uropathy: Secondary | ICD-10-CM

## 2020-05-12 DIAGNOSIS — R972 Elevated prostate specific antigen [PSA]: Secondary | ICD-10-CM

## 2020-05-12 DIAGNOSIS — N401 Enlarged prostate with lower urinary tract symptoms: Secondary | ICD-10-CM | POA: Diagnosis not present

## 2020-05-12 DIAGNOSIS — R351 Nocturia: Secondary | ICD-10-CM

## 2020-05-12 LAB — MICROSCOPIC EXAMINATION
Bacteria, UA: NONE SEEN
Epithelial Cells (non renal): NONE SEEN /hpf (ref 0–10)
RBC, Urine: NONE SEEN /hpf (ref 0–2)
Renal Epithel, UA: NONE SEEN /hpf
WBC, UA: NONE SEEN /hpf (ref 0–5)

## 2020-05-12 LAB — URINALYSIS, ROUTINE W REFLEX MICROSCOPIC
Bilirubin, UA: NEGATIVE
Glucose, UA: NEGATIVE
Ketones, UA: NEGATIVE
Leukocytes,UA: NEGATIVE
Nitrite, UA: NEGATIVE
Protein,UA: NEGATIVE
RBC, UA: NEGATIVE
Specific Gravity, UA: 1.02 (ref 1.005–1.030)
Urobilinogen, Ur: 0.2 mg/dL (ref 0.2–1.0)
pH, UA: 5 (ref 5.0–7.5)

## 2020-05-12 MED ORDER — ALFUZOSIN HCL ER 10 MG PO TB24: 10.0000 mg | ORAL_TABLET | Freq: Every day | ORAL | 11 refills | Status: DC

## 2020-05-12 NOTE — Progress Notes (Signed)
Pt seen in office today.

## 2020-05-12 NOTE — Progress Notes (Signed)
05/12/2020 11:09 AM   Chris [name]. 23-Apr-1945 [id number]  Referring provider: Rory Percy, MD [place],  [place] [address]  followup elevated PSA  HPI: Chris [name] is a 75yo here for followup elevated PSA. PSA increased to 14.4 fro 5.1. His Stream has become weak, he has new nocturia 5-6x, hesitancy increased, he has starting/stopping and dribbling. He is very unhappy with his urination. He is on flomax 0.4mg  daily and mirabegron 25mg . No urge incontinence.   PMH: Past Medical History:  Diagnosis Date  . Anxiety   . Chest pain, unspecified   . Depression   . Dyslexia   . Hearing loss    Attributed to recurring sinus infections  . Seasonal allergies   . Stroke [facility]    Right lateral temporal lobe  . Unspecified essential hypertension     Surgical History: Past Surgical History:  Procedure Laterality Date  . HIP ARTHROPLASTY    . TOTAL KNEE ARTHROPLASTY      Home Medications:  Allergies as of 05/12/2020   No Known Allergies     Medication List       Accurate as of May 12, 2020 11:09 AM. If you have any questions, ask your nurse or doctor.        STOP taking these medications   clonazePAM 1 MG tablet Commonly known as: KLONOPIN Stopped by: Nicolette Bang, MD   mirabegron ER 25 MG Tb24 tablet Commonly known as: MYRBETRIQ Stopped by: Nicolette Bang, MD   tamsulosin 0.4 MG Caps capsule Commonly known as: FLOMAX Stopped by: Nicolette Bang, MD     TAKE these medications   acetaminophen 325 MG tablet Commonly known as: TYLENOL Take 650 mg by mouth every 6 (six) hours as needed.   alfuzosin 10 MG 24 hr tablet Commonly known as: UROXATRAL Take 1 tablet (10 mg total) by mouth daily with breakfast. Started by: Nicolette Bang, MD   [facility] HEADACHE PO Take by mouth.   buPROPion 150 MG 24 hr tablet Commonly known as: WELLBUTRIN XL   busPIRone 15 MG tablet Commonly known as: BUSPAR Take 15 mg by mouth 3 (three) times daily.    desvenlafaxine 100 MG 24 hr tablet Commonly known as: PRISTIQ   donepezil 10 MG tablet Commonly known as: ARICEPT Take 1 tablet daily   fluticasone 50 MCG/ACT nasal spray Commonly known as: FLONASE   gabapentin 100 MG capsule Commonly known as: Neurontin Take 1 capsule every night   ibuprofen 200 MG tablet Commonly known as: ADVIL Take 200 mg by mouth every 6 (six) hours as needed.   losartan 25 MG tablet Commonly known as: COZAAR Take 25 mg by mouth daily.   Meclizine HCl 25 MG Chew Chew by mouth.   Melatonin 10 MG Caps Take 20 mg by mouth at bedtime.   memantine 10 MG tablet Commonly known as: Namenda Take 1 tablet twice a day   montelukast 10 MG tablet Commonly known as: SINGULAIR   Mucinex Sinus-Max 5-10-200-325 MG Tabs Generic drug: Phenylephrine-DM-GG-APAP Take by mouth.   Multivital tablet Take 1 tablet by mouth daily.   vitamin B-12 100 MCG tablet Commonly known as: CYANOCOBALAMIN Take 100 mcg by mouth daily.       Allergies: No Known Allergies  Family History: Family History  Problem Relation Age of Onset  . Heart disease Mother   . Cancer Father     Social History:  reports that he quit smoking about 50 years ago. He has a 1.00  pack-year smoking history. He has never used smokeless tobacco. He reports that he does not drink alcohol and does not use drugs.  ROS: All other review of systems were reviewed and are negative except what is noted above in HPI  Physical Exam: BP 136/79   Pulse 74   Temp 98.4 F (36.9 C) (Oral)   Ht 5' 7.5" (1.715 m)   Wt 174 lb (78.9 kg)   BMI 26.85 kg/m   Constitutional:  Alert and oriented, No acute distress. HEENT: [place] AT, moist mucus membranes.  Trachea midline, no masses. Cardiovascular: No clubbing, cyanosis, or edema. Respiratory: Normal respiratory effort, no increased work of breathing. GI: Abdomen is soft, nontender, nondistended, no abdominal masses GU: No CVA tenderness.  Lymph: No cervical  or inguinal lymphadenopathy. Skin: No rashes, bruises or suspicious lesions. Neurologic: Grossly intact, no focal deficits, moving all 4 extremities. Psychiatric: Normal mood and affect.  Laboratory Data: Lab Results  Component Value Date   WBC 5.7 01/18/2008   HGB 13.8 01/18/2008   HCT 39.8 01/18/2008   MCV 92.6 01/18/2008   PLT 159 01/18/2008    Lab Results  Component Value Date   CREATININE 0.88 01/18/2008    No results found for: PSA  No results found for: TESTOSTERONE  No results found for: HGBA1C  Urinalysis    Component Value Date/Time   APPEARANCEUR Clear 02/04/2020 1327   GLUCOSEU Negative 02/04/2020 1327   BILIRUBINUR Negative 02/04/2020 1327   PROTEINUR Trace (A) 02/04/2020 1327   UROBILINOGEN 0.2 08/04/2019 1340   NITRITE Negative 02/04/2020 1327   LEUKOCYTESUR Negative 02/04/2020 1327    Lab Results  Component Value Date   LABMICR See below: 02/04/2020   WBCUA None seen 02/04/2020   LABEPIT 0-10 02/04/2020   MUCUS Present 02/04/2020   BACTERIA None seen 02/04/2020    Pertinent Imaging:  No results found for this or any previous visit.  No results found for this or any previous visit.  No results found for this or any previous visit.  No results found for this or any previous visit.  Results for orders placed during the hospital encounter of 01/22/20  Ultrasound renal complete  Narrative CLINICAL DATA:  Nephrolithiasis  EXAM: RENAL / URINARY TRACT ULTRASOUND COMPLETE  COMPARISON:  CT abdomen and pelvis 07/27/2019  FINDINGS: Right Kidney:  Renal measurements: 9.9 x 5.5 x 5.1 cm = volume: 145 mL. Normal cortical thickness. Upper normal cortical echogenicity. No mass, hydronephrosis or shadowing calcification.  Left Kidney:  Renal measurements: 10.8 x 5.5 x 4.4 cm = volume: 137 mL. Cortical thinning. Normal cortical echogenicity. No mass, hydronephrosis, or shadowing calcification.  Bladder:  Appears normal for degree of  bladder distention. RIGHT ureteral jet was visualized.  Other:  No postvoid residual within urinary bladder.  IMPRESSION: No renal sonographic abnormalities.   Electronically Signed By: Lavonia Dana M.D. On: 01/22/2020 15:06  No results found for this or any previous visit.  No results found for this or any previous visit.  No results found for this or any previous visit.   Assessment & Plan:    1. Elevated PSA The patient and I talked about etiologies of elevated PSA.  We discussed the possible relationship between elevated PSA and prostate cancer, BPH, prostatitis, infection trauma and recent ejaculations.  The patient's PSA has been relatively stable over the interval and as such I recommended that we follow-up with a repeat PSA in 3 months.  If it remains elevated with a positive rising trend  we will discuss prostate biopsy at his follow-up appointment. - Urinalysis, Routine w reflex microscopic  2. Benign prostatic hyperplasia with urinary obstruction -start uroxatral 10mg  qhs  3. Nocturia -uroxatral 10mg  qhs   No follow-ups on file.  Nicolette Bang, MD  [facility] [place]

## 2020-05-12 NOTE — Progress Notes (Signed)
Urological Symptom Review  Patient is experiencing the following symptoms: Frequent urination Get up at night to urinate Stream starts and stops   Review of Systems  Gastrointestinal (upper)  : Nausea  Gastrointestinal (lower) : Negative for lower GI symptoms  Constitutional : Negative for symptoms  Skin: Negative for skin symptoms  Eyes: Negative for eye symptoms  Ear/Nose/Throat : Negative for Ear/Nose/Throat symptoms  Hematologic/Lymphatic: Negative for Hematologic/Lymphatic symptoms  Cardiovascular : Negative for cardiovascular symptoms  Respiratory : Negative for respiratory symptoms  Endocrine: Negative for endocrine symptoms  Musculoskeletal: Negative for musculoskeletal symptoms  Neurological: Headaches Dizziness  Psychologic: Depression Anxiety

## 2020-05-12 NOTE — Patient Instructions (Signed)

## 2020-06-01 DIAGNOSIS — J0101 Acute recurrent maxillary sinusitis: Secondary | ICD-10-CM | POA: Diagnosis not present

## 2020-06-01 DIAGNOSIS — J029 Acute pharyngitis, unspecified: Secondary | ICD-10-CM | POA: Diagnosis not present

## 2020-06-01 DIAGNOSIS — K130 Diseases of lips: Secondary | ICD-10-CM | POA: Diagnosis not present

## 2020-06-10 DIAGNOSIS — G3184 Mild cognitive impairment, so stated: Secondary | ICD-10-CM | POA: Diagnosis not present

## 2020-06-10 DIAGNOSIS — F419 Anxiety disorder, unspecified: Secondary | ICD-10-CM | POA: Diagnosis not present

## 2020-06-10 DIAGNOSIS — F332 Major depressive disorder, recurrent severe without psychotic features: Secondary | ICD-10-CM | POA: Diagnosis not present

## 2020-06-14 DIAGNOSIS — J019 Acute sinusitis, unspecified: Secondary | ICD-10-CM | POA: Diagnosis not present

## 2020-06-14 DIAGNOSIS — Z20828 Contact with and (suspected) exposure to other viral communicable diseases: Secondary | ICD-10-CM | POA: Diagnosis not present

## 2020-07-01 DIAGNOSIS — I10 Essential (primary) hypertension: Secondary | ICD-10-CM | POA: Diagnosis not present

## 2020-07-01 DIAGNOSIS — F0390 Unspecified dementia without behavioral disturbance: Secondary | ICD-10-CM | POA: Diagnosis not present

## 2020-07-01 DIAGNOSIS — R42 Dizziness and giddiness: Secondary | ICD-10-CM | POA: Diagnosis not present

## 2020-07-01 DIAGNOSIS — Z6828 Body mass index (BMI) 28.0-28.9, adult: Secondary | ICD-10-CM | POA: Diagnosis not present

## 2020-07-01 DIAGNOSIS — F411 Generalized anxiety disorder: Secondary | ICD-10-CM | POA: Diagnosis not present

## 2020-07-01 DIAGNOSIS — J0101 Acute recurrent maxillary sinusitis: Secondary | ICD-10-CM | POA: Diagnosis not present

## 2020-07-01 DIAGNOSIS — Z23 Encounter for immunization: Secondary | ICD-10-CM | POA: Diagnosis not present

## 2020-07-09 ENCOUNTER — Encounter: Payer: Self-pay | Admitting: Urology

## 2020-07-09 ENCOUNTER — Ambulatory Visit (INDEPENDENT_AMBULATORY_CARE_PROVIDER_SITE_OTHER): Payer: Medicare PPO | Admitting: Urology

## 2020-07-09 ENCOUNTER — Other Ambulatory Visit: Payer: Self-pay

## 2020-07-09 VITALS — BP 133/89 | HR 71 | Temp 98.7°F | Resp 18 | Ht 67.5 in | Wt 174.0 lb

## 2020-07-09 DIAGNOSIS — N138 Other obstructive and reflux uropathy: Secondary | ICD-10-CM | POA: Diagnosis not present

## 2020-07-09 DIAGNOSIS — N401 Enlarged prostate with lower urinary tract symptoms: Secondary | ICD-10-CM | POA: Diagnosis not present

## 2020-07-09 DIAGNOSIS — R351 Nocturia: Secondary | ICD-10-CM

## 2020-07-09 DIAGNOSIS — R972 Elevated prostate specific antigen [PSA]: Secondary | ICD-10-CM

## 2020-07-09 LAB — URINALYSIS, ROUTINE W REFLEX MICROSCOPIC
Bilirubin, UA: NEGATIVE
Glucose, UA: NEGATIVE
Ketones, UA: NEGATIVE
Leukocytes,UA: NEGATIVE
Nitrite, UA: NEGATIVE
Protein,UA: NEGATIVE
Specific Gravity, UA: 1.02 (ref 1.005–1.030)
Urobilinogen, Ur: 0.2 mg/dL (ref 0.2–1.0)
pH, UA: 6 (ref 5.0–7.5)

## 2020-07-09 LAB — MICROSCOPIC EXAMINATION
Bacteria, UA: NONE SEEN
Epithelial Cells (non renal): NONE SEEN /hpf (ref 0–10)
Renal Epithel, UA: NONE SEEN /hpf
WBC, UA: NONE SEEN /hpf (ref 0–5)

## 2020-07-09 MED ORDER — SILODOSIN 8 MG PO CAPS: 8.0000 mg | ORAL_CAPSULE | Freq: Every day | ORAL | 11 refills | Status: DC

## 2020-07-09 NOTE — Patient Instructions (Signed)

## 2020-07-09 NOTE — Progress Notes (Signed)
07/09/2020 11:59 AM   Chris [name]. 1945-11-19 [id number]  Referring provider: Rory Percy, MD [place],  [place] [address]  Chief Complaint  Patient presents with  . Follow-up    HPI: [name] is a 75yo here for followup for BPH and elevated PSA. No recent PSA. He continues to have severe LUTS on both uroxatral and flomax. He is unhappy with his urination. Nocturia 3-4x, stream is weak. No dysuria or hematuria.  IPSS 21 QOL 4.    PMH: Past Medical History:  Diagnosis Date  . Anxiety   . Chest pain, unspecified   . Depression   . Dyslexia   . Hearing loss    Attributed to recurring sinus infections  . Seasonal allergies   . Stroke [facility])    Right lateral temporal lobe  . Unspecified essential hypertension     Surgical History: Past Surgical History:  Procedure Laterality Date  . HIP ARTHROPLASTY    . TOTAL KNEE ARTHROPLASTY      Home Medications:  Allergies as of 07/09/2020   No Known Allergies     Medication List       Accurate as of July 09, 2020 11:59 AM. If you have any questions, ask your nurse or doctor.        acetaminophen 325 MG tablet Commonly known as: TYLENOL Take 650 mg by mouth every 6 (six) hours as needed.   alfuzosin 10 MG 24 hr tablet Commonly known as: UROXATRAL Take 1 tablet (10 mg total) by mouth daily with breakfast.   BC HEADACHE PO Take by mouth.   buPROPion 150 MG 24 hr tablet Commonly known as: WELLBUTRIN XL   busPIRone 15 MG tablet Commonly known as: BUSPAR Take 15 mg by mouth 3 (three) times daily.   desvenlafaxine 100 MG 24 hr tablet Commonly known as: PRISTIQ   donepezil 10 MG tablet Commonly known as: ARICEPT Take 1 tablet daily   fluticasone 50 MCG/ACT nasal spray Commonly known as: FLONASE   gabapentin 100 MG capsule Commonly known as: Neurontin Take 1 capsule every night   ibuprofen 200 MG tablet Commonly known as: ADVIL Take 200 mg by mouth every 6 (six) hours as needed.    losartan 25 MG tablet Commonly known as: COZAAR Take 25 mg by mouth daily.   Meclizine HCl 25 MG Chew Chew by mouth.   Melatonin 10 MG Caps Take 20 mg by mouth at bedtime.   memantine 10 MG tablet Commonly known as: Namenda Take 1 tablet twice a day   montelukast 10 MG tablet Commonly known as: SINGULAIR   Mucinex Sinus-Max 5-10-200-325 MG Tabs Generic drug: Phenylephrine-DM-GG-APAP Take by mouth.   Multivital tablet Take 1 tablet by mouth daily.   vitamin B-12 100 MCG tablet Commonly known as: CYANOCOBALAMIN Take 100 mcg by mouth daily.       Allergies: No Known Allergies  Family History: Family History  Problem Relation Age of Onset  . Heart disease Mother   . Cancer Father     Social History:  reports that he quit smoking about 50 years ago. He has a 1.00 pack-year smoking history. He has never used smokeless tobacco. He reports that he does not drink alcohol and does not use drugs.  ROS: All other review of systems were reviewed and are negative except what is noted above in HPI  Physical Exam: BP 133/89   Pulse 71   Temp 98.7 F (37.1 C) (Oral)   Resp 18  Ht 5' 7.5" (1.715 m)   Wt 174 lb (78.9 kg)   BMI 26.85 kg/m   Constitutional:  Alert and oriented, No acute distress. HEENT: [place] AT, moist mucus membranes.  Trachea midline, no masses. Cardiovascular: No clubbing, cyanosis, or edema. Respiratory: Normal respiratory effort, no increased work of breathing. GI: Abdomen is soft, nontender, nondistended, no abdominal masses GU: No CVA tenderness.  Lymph: No cervical or inguinal lymphadenopathy. Skin: No rashes, bruises or suspicious lesions. Neurologic: Grossly intact, no focal deficits, moving all 4 extremities. Psychiatric: Normal mood and affect.  Laboratory Data: Lab Results  Component Value Date   WBC 5.7 01/18/2008   HGB 13.8 01/18/2008   HCT 39.8 01/18/2008   MCV 92.6 01/18/2008   PLT 159 01/18/2008    Lab Results  Component Value  Date   CREATININE 0.88 01/18/2008    No results found for: PSA  No results found for: TESTOSTERONE  No results found for: HGBA1C  Urinalysis    Component Value Date/Time   APPEARANCEUR Clear 05/12/2020 1038   GLUCOSEU Negative 05/12/2020 1038   BILIRUBINUR Negative 05/12/2020 1038   PROTEINUR Negative 05/12/2020 1038   UROBILINOGEN 0.2 08/04/2019 1340   NITRITE Negative 05/12/2020 1038   LEUKOCYTESUR Negative 05/12/2020 1038    Lab Results  Component Value Date   LABMICR See below: 05/12/2020   WBCUA None seen 05/12/2020   LABEPIT None seen 05/12/2020   MUCUS Present 02/04/2020   BACTERIA None seen 05/12/2020    Pertinent Imaging:  No results found for this or any previous visit.  No results found for this or any previous visit.  No results found for this or any previous visit.  No results found for this or any previous visit.  Results for orders placed during the hospital encounter of 01/22/20  Ultrasound renal complete  Narrative CLINICAL DATA:  Nephrolithiasis  EXAM: RENAL / URINARY TRACT ULTRASOUND COMPLETE  COMPARISON:  CT abdomen and pelvis 07/27/2019  FINDINGS: Right Kidney:  Renal measurements: 9.9 x 5.5 x 5.1 cm = volume: 145 mL. Normal cortical thickness. Upper normal cortical echogenicity. No mass, hydronephrosis or shadowing calcification.  Left Kidney:  Renal measurements: 10.8 x 5.5 x 4.4 cm = volume: 137 mL. Cortical thinning. Normal cortical echogenicity. No mass, hydronephrosis, or shadowing calcification.  Bladder:  Appears normal for degree of bladder distention. RIGHT ureteral jet was visualized.  Other:  No postvoid residual within urinary bladder.  IMPRESSION: No renal sonographic abnormalities.   Electronically Signed By: Lavonia Dana M.D. On: 01/22/2020 15:06  No results found for this or any previous visit.  No results found for this or any previous visit.  No results found for this or any previous  visit.   Assessment & Plan:    1. Benign prostatic hyperplasia with urinary obstruction -we will start rapaflo 8mg  daily  2. Elevated PSA -PSA today  3. Nocturia -rapaflo 8mg  daily    No follow-ups on file.  Nicolette Bang, MD  [facility] [place]

## 2020-07-10 LAB — PSA: Prostate Specific Ag, Serum: 5.6 ng/mL — ABNORMAL HIGH (ref 0.0–4.0)

## 2020-07-13 ENCOUNTER — Telehealth: Payer: Self-pay

## 2020-07-13 NOTE — Telephone Encounter (Signed)
-----   Message from Cleon Gustin, MD sent at 07/13/2020 10:39 AM EDT ----- PSA decreased. I will see him back in 6 months with a pSA ----- Message ----- From: Valentina Lucks, LPN Sent: 10/11/9483   8:51 AM EDT To: Cleon Gustin, MD  Pls review.

## 2020-07-13 NOTE — Telephone Encounter (Signed)
Pt scheduled and his wife notified of results an new appointments.

## 2020-09-10 ENCOUNTER — Other Ambulatory Visit: Payer: Self-pay

## 2020-09-10 ENCOUNTER — Ambulatory Visit: Payer: Medicare PPO | Admitting: Neurology

## 2020-09-10 ENCOUNTER — Encounter: Payer: Self-pay | Admitting: Neurology

## 2020-09-10 VITALS — BP 142/86 | HR 84 | Ht 68.0 in | Wt 185.6 lb

## 2020-09-10 DIAGNOSIS — F0390 Unspecified dementia without behavioral disturbance: Secondary | ICD-10-CM

## 2020-09-10 DIAGNOSIS — R519 Headache, unspecified: Secondary | ICD-10-CM | POA: Diagnosis not present

## 2020-09-10 DIAGNOSIS — F03A0 Unspecified dementia, mild, without behavioral disturbance, psychotic disturbance, mood disturbance, and anxiety: Secondary | ICD-10-CM

## 2020-09-10 DIAGNOSIS — G43009 Migraine without aura, not intractable, without status migrainosus: Secondary | ICD-10-CM

## 2020-09-10 MED ORDER — GABAPENTIN 100 MG PO CAPS: ORAL_CAPSULE | ORAL | 11 refills | Status: DC

## 2020-09-10 MED ORDER — MEMANTINE HCL 10 MG PO TABS: ORAL_TABLET | ORAL | 11 refills | Status: DC

## 2020-09-10 MED ORDER — DONEPEZIL HCL 10 MG PO TABS: ORAL_TABLET | ORAL | 3 refills | Status: DC

## 2020-09-10 NOTE — Patient Instructions (Signed)
Good to see you!  1. Continue Donepezil 10mg  daily and Memantine 10mg  twice a day  2. Continue Gabapentin 100mg  every night  3. Follow-up in 6 months, call for any changes   FALL PRECAUTIONS: Be cautious when walking. Scan the area for obstacles that may increase the risk of trips and falls. When getting up in the mornings, sit up at the edge of the bed for a few minutes before getting out of bed. Consider elevating the bed at the head end to avoid drop of blood pressure when getting up. Walk always in a well-lit room (use night lights in the walls). Avoid area rugs or power cords from appliances in the middle of the walkways. Use a walker or a cane if necessary and consider physical therapy for balance exercise. Get your eyesight checked regularly.   HOME SAFETY: Consider the safety of the kitchen when operating appliances like stoves, microwave oven, and blender. Consider having supervision and share cooking responsibilities until no longer able to participate in those. Accidents with firearms and other hazards in the house should be identified and addressed as well.  ABILITY TO BE LEFT ALONE: If patient is unable to contact 911 operator, consider using LifeLine, or when the need is there, arrange for someone to stay with patients. Smoking is a fire hazard, consider supervision or cessation. Risk of wandering should be assessed by caregiver and if detected at any point, supervision and safe proof recommendations should be instituted.  RECOMMENDATIONS FOR ALL PATIENTS WITH MEMORY PROBLEMS: 1. Continue to exercise (Recommend 30 minutes of walking everyday, or 3 hours every week) 2. Increase social interactions - continue going to [place] and enjoy social gatherings with friends and family 3. Eat healthy, avoid fried foods and eat more fruits and vegetables 4. Maintain adequate blood pressure, blood sugar, and blood cholesterol level. Reducing the risk of stroke and cardiovascular disease also  helps promoting better memory. 5. Avoid stressful situations. Live a simple life and avoid aggravations. Organize your time and prepare for the next day in anticipation. 6. Sleep well, avoid any interruptions of sleep and avoid any distractions in the bedroom that may interfere with adequate sleep quality 7. Avoid sugar, avoid sweets as there is a strong link between excessive sugar intake, diabetes, and cognitive impairment The Mediterranean diet has been shown to help patients reduce the risk of progressive memory disorders and reduces cardiovascular risk. This includes eating fish, eat fruits and green leafy vegetables, nuts like almonds and hazelnuts, walnuts, and also use olive oil. Avoid fast foods and fried foods as much as possible. Avoid sweets and sugar as sugar use has been linked to worsening of memory function.  There is always a concern of gradual progression of memory problems. If this is the case, then we may need to adjust level of care according to patient needs. Support, both to the patient and caregiver, should then be put into place.

## 2020-09-10 NOTE — Progress Notes (Signed)
NEUROLOGY FOLLOW UP OFFICE NOTE  Chris [name] [id number] [birth year]  HISTORY OF PRESENT ILLNESS: I had the pleasure of seeing Chris [name] in follow-up in the neurology clinic on 09/10/2020.  The patient was last seen 7 months ago for mixed dementia. He is again accompanied by his wife who helps supplement the history today. Since his last visit, he feels he is doing pretty good with his memory. His wife feels it is has gotten a little worse, he is having more trouble remembering names and finding words. His wife manages finances, medications, meals. He does not drive. He was having bad dreams which resolved with taking Donepezil 10mg  in the morning instead of at bedtime. He is also on Memantine 10mg  BID. No hallucinations, paranoia, personality changes. His wife reports sometimes he talks out loud to himself during the day, he states he feels dizzy sometimes when he gets up so he counts his steps loudly. No falls. He is independent with dressing and bathing. His main concern on last visit was chronic daily headaches with frontal pressure where he feels bad all day. He was started on gabapentin 100mg  qhs which they report have helped some, he still has daily headaches but they are not as bad. No side effects on gabapentin.    History on Initial Assessment 05/30/2018: This is a 75 year old right-handed man with a history of diet-controlled hypertension, depression, anxiety, presenting for evaluation of worsening memory. He states his memory is good and bad at certain things, she cannot recall what he did yesterday, he would not recall the date and have to look at his calendar. He denies getting lost driving. His wife occasionally reminds him to take his medications. He has frequent headaches and wants to take over the counter medication all day long. His wife manages bills. His wife states he has never been one to remember names, but in the past 6 months, she has noticed he would have difficulty  thinking of the name of an object and get dates and times confused. He would ask her 5 times in one day what time his doctor appointment was. She denies any significant driving concerns. She has noticed that he gets really nervous and shaky around 2pm, then calms down by 8pm. Sleep is good, no wandering behavior. He still feels drowsy during the day. No paranoia or hallucinations. They report he had failed several grades in school, joined the [employer], then went to [employer] where he had a GPA of 3.85. It was in college where he was formally diagnosed with dyslexia. 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 There is no family history of dementia. No history of significant head injuries. He drinks 1 big can of beer a week, sometimes he drinks 3-4 times a week.   He has had daily headaches for several years and saw neurologist Dr. Domingo Cocking in 2018. He had trigger point injections and became headache-free for a while, until he had a fall with loss of consciousness last September 2019 and headaches recurred. He has a moderate ache in the frontal region constantly, no nausea/vomiting, photo/phonophobia. He has been taking Tylenol and BC powders but states he does not take it quite everyday. He has dizziness with lightheadedness sometimes lasting all day until he takes meclizine which helps. He denies any diplopia, dysarthria/dysphagia, back pain, bladder dysfunction, anosmia. He has occasional neck pain and constipation. He started noticing bilateral hand tremors 2-3 years ago which do not affect writing or  using utensils. He fell while walking on a trail last September, he recalls starting feeling bad in his stomach and started walking faster, then passed out. He feels he was only out for a second, he does not remember hitting the ground but stood up and and realized he had fallen down. He had significant injury on the left eyelid and left side of his face and underwent plastic surgery. He  still has stitches on the left lateral eyelid. He has occasional numbness and tingling in his right hand, it feels cold sometimes. He has noticed difficulty buttoning or tying shoelaces with his right hand. His wife has noticed shuffling when walking over the past year.  I personally reviewed MRI brain without contrast done 11/27/2016 which did not show any acute changes. There was scattered foci of susceptibility are present over the posterior left temporal lobe and occipital lobe, increased dural vasculature is suggested on axial image 8 of series 7. 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 There was moderate diffuse atrophy and mild chronic microvascular disease  I personally reviewed MRI brain with and without contrast done February 2020 which did not show any acute changes.There was stable small chronic infarct within the right lateral temporal lobe, stable mild chronic microvascular disease, moderate diffuse volume loss with prominent volume loss in the anteromedial temporal lobes and parahippocampal gyri (which can be seen with Alzheimer's or frontotemporal lobar degeneration). There were numerous foci of chronic microhemorrhages predominantly in the posterior distribution, greater on the left (favoring amyloid angiopathy).   Neuropsychological testing in 02/2019 showed Major Neurocognitive Disorder (dementia), mild end of spectrum. Etiology likely multifactorial, vascular and Alzheimer's disease.   TSH and B12 unremarkable.     PAST MEDICAL HISTORY: Past Medical History:  Diagnosis Date  . Anxiety   . Chest pain, unspecified   . Depression   . Dyslexia   . Hearing loss    Attributed to recurring sinus infections  . Seasonal allergies   . Stroke [facility])    Right lateral temporal lobe  . Unspecified essential hypertension     MEDICATIONS: Current Outpatient Medications on File Prior to Visit  Medication Sig Dispense Refill  . acetaminophen (TYLENOL) 325 MG tablet Take  650 mg by mouth every 6 (six) hours as needed.    . Aspirin-Salicylamide-Caffeine (BC HEADACHE PO) Take by mouth.    Marland Kitchen buPROPion (WELLBUTRIN XL) 150 MG 24 hr tablet     . busPIRone (BUSPAR) 15 MG tablet Take 15 mg by mouth 3 (three) times daily.     Marland Kitchen desvenlafaxine (PRISTIQ) 100 MG 24 hr tablet     . donepezil (ARICEPT) 10 MG tablet Take 1 tablet daily 90 tablet 3  . fluticasone (FLONASE) 50 MCG/ACT nasal spray     . gabapentin (NEURONTIN) 100 MG capsule Take 1 capsule every night 30 capsule 11  . ibuprofen (ADVIL) 200 MG tablet Take 200 mg by mouth every 6 (six) hours as needed.    Marland Kitchen losartan (COZAAR) 25 MG tablet Take 25 mg by mouth daily.    . Meclizine HCl 25 MG CHEW Chew by mouth.    . Melatonin 10 MG CAPS Take 20 mg by mouth at bedtime.    . memantine (NAMENDA) 10 MG tablet Take 1 tablet twice a day 60 tablet 11  . montelukast (SINGULAIR) 10 MG tablet     . Multiple Vitamins-Minerals (MULTIVITAL) tablet Take 1 tablet by mouth daily.    Marland Kitchen Phenylephrine-DM-GG-APAP (MUCINEX SINUS-MAX) 5-10-200-325 MG TABS Take  by mouth.    . silodosin (RAPAFLO) 8 MG CAPS capsule Take 1 capsule (8 mg total) by mouth daily with breakfast. 30 capsule 11  . vitamin B-12 (CYANOCOBALAMIN) 100 MCG tablet Take 100 mcg by mouth daily.     No current facility-administered medications on file prior to visit.    ALLERGIES: No Known Allergies  FAMILY HISTORY: Family History  Problem Relation Age of Onset  . Heart disease Mother   . Cancer Father     SOCIAL HISTORY: Social History   Socioeconomic History  . Marital status: Married    Spouse name: Not on file  . Number of children: 2  . Years of education: 16  . Highest education level: Bachelor's degree (e.g., BA, AB, BS)  Occupational History  . Not on file  Tobacco Use  . Smoking status: Former Smoker    Packs/day: 0.50    Years: 2.00    Pack years: 1.00    Quit date: 08/03/1969    Years since quitting: 51.1  . Smokeless tobacco: Never Used   Vaping Use  . Vaping Use: Never used  Substance and Sexual Activity  . Alcohol use: Yes    Comment: rare  . Drug use: No  . Sexual activity: Yes  Other Topics Concern  . 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 No aphasia or dysarthria. Fund of knowledge is appropriate.  Recent and remote memory are intact.  Attention and concentration are normal. MMSE 25/30  MMSE - Mini Mental State Exam 09/10/2020  Orientation to time 4  Orientation to Place 5  Registration 3  Attention/ Calculation 4  Recall 2  Language- name 2 objects 2  Language- repeat 1  Language- follow 3 step command 1  Language- read & follow direction 1  Write a sentence 1  Copy design 1  Total score 25   Cranial nerves: Pupils equal, round. Extraocular movements intact with no nystagmus. Visual fields full.  No facial asymmetry.  Motor: Bulk and tone normal, muscle strength 5/5 throughout with no pronator drift.   Finger to nose testing intact.  Gait narrow-based and steady, no ataxia   IMPRESSION: This is a 75 yo RH man with a history of diet-controlled hypertension, depression, anxiety, and mild dementia. Neuropsychological testing in 02/2019 indicated Major Neurocognitive Disorder (ie dementia),  mild, likely multifactorial, vascular and Alzheimer's disease. MRI brain no acute changes, there was note of temporal lobe atrophy as well as chronic microhemorrhages. MMSE today 24/30. Continue Donepezil 10mg  daily and Memantine 10mg  BID. No significant behavioral issues. There has been some improvement in daily headaches with low dose gabapentin 100mg  qhs, he would like to stay on this dose for now. Continue  close supervision. He does not drive. Follow-up with our Memory Disorders PA Sharene Butters in 6 months, they know to call for any changes.   Thank you for allowing me to participate in his care.  Please do not hesitate to call for any questions or concerns.   Ellouise Newer, M.D.   CC: Dr. Nadara Mustard

## 2020-10-07 DIAGNOSIS — J0101 Acute recurrent maxillary sinusitis: Secondary | ICD-10-CM | POA: Diagnosis not present

## 2020-10-07 DIAGNOSIS — Z20828 Contact with and (suspected) exposure to other viral communicable diseases: Secondary | ICD-10-CM | POA: Diagnosis not present

## 2020-10-11 ENCOUNTER — Ambulatory Visit: Payer: Medicare PPO

## 2020-10-11 ENCOUNTER — Ambulatory Visit: Payer: Medicare PPO | Admitting: Urology

## 2020-11-19 DIAGNOSIS — J0101 Acute recurrent maxillary sinusitis: Secondary | ICD-10-CM | POA: Diagnosis not present

## 2020-11-19 DIAGNOSIS — J029 Acute pharyngitis, unspecified: Secondary | ICD-10-CM | POA: Diagnosis not present

## 2020-11-19 DIAGNOSIS — Z20828 Contact with and (suspected) exposure to other viral communicable diseases: Secondary | ICD-10-CM | POA: Diagnosis not present

## 2021-01-03 ENCOUNTER — Other Ambulatory Visit: Payer: Medicare PPO

## 2021-01-03 DIAGNOSIS — I10 Essential (primary) hypertension: Secondary | ICD-10-CM | POA: Diagnosis not present

## 2021-01-03 DIAGNOSIS — R42 Dizziness and giddiness: Secondary | ICD-10-CM | POA: Diagnosis not present

## 2021-01-03 DIAGNOSIS — Z6829 Body mass index (BMI) 29.0-29.9, adult: Secondary | ICD-10-CM | POA: Diagnosis not present

## 2021-01-03 DIAGNOSIS — R519 Headache, unspecified: Secondary | ICD-10-CM | POA: Diagnosis not present

## 2021-01-03 DIAGNOSIS — F411 Generalized anxiety disorder: Secondary | ICD-10-CM | POA: Diagnosis not present

## 2021-01-03 DIAGNOSIS — J0101 Acute recurrent maxillary sinusitis: Secondary | ICD-10-CM | POA: Diagnosis not present

## 2021-01-03 DIAGNOSIS — F0390 Unspecified dementia without behavioral disturbance: Secondary | ICD-10-CM | POA: Diagnosis not present

## 2021-01-04 ENCOUNTER — Other Ambulatory Visit: Payer: Self-pay

## 2021-01-04 ENCOUNTER — Other Ambulatory Visit: Payer: Medicare PPO

## 2021-01-04 ENCOUNTER — Ambulatory Visit (HOSPITAL_COMMUNITY)
Admission: RE | Admit: 2021-01-04 | Discharge: 2021-01-04 | Disposition: A | Discharge status: Home / Self Care | Payer: Medicare PPO | Source: Ambulatory Visit | Attending: Urology | Admitting: Urology

## 2021-01-04 DIAGNOSIS — R972 Elevated prostate specific antigen [PSA]: Secondary | ICD-10-CM | POA: Diagnosis not present

## 2021-01-04 DIAGNOSIS — N200 Calculus of kidney: Secondary | ICD-10-CM | POA: Insufficient documentation

## 2021-01-05 LAB — PSA: Prostate Specific Ag, Serum: 10.5 ng/mL — ABNORMAL HIGH (ref 0.0–4.0)

## 2021-01-10 ENCOUNTER — Ambulatory Visit: Payer: Medicare PPO | Admitting: Urology

## 2021-01-10 ENCOUNTER — Other Ambulatory Visit: Payer: Self-pay

## 2021-01-10 ENCOUNTER — Encounter: Payer: Self-pay | Admitting: Urology

## 2021-01-10 VITALS — BP 142/95 | HR 80 | Wt 176.0 lb

## 2021-01-10 DIAGNOSIS — R351 Nocturia: Secondary | ICD-10-CM | POA: Diagnosis not present

## 2021-01-10 DIAGNOSIS — N401 Enlarged prostate with lower urinary tract symptoms: Secondary | ICD-10-CM | POA: Diagnosis not present

## 2021-01-10 DIAGNOSIS — N200 Calculus of kidney: Secondary | ICD-10-CM

## 2021-01-10 DIAGNOSIS — N138 Other obstructive and reflux uropathy: Secondary | ICD-10-CM | POA: Diagnosis not present

## 2021-01-10 DIAGNOSIS — R972 Elevated prostate specific antigen [PSA]: Secondary | ICD-10-CM

## 2021-01-10 LAB — URINALYSIS, ROUTINE W REFLEX MICROSCOPIC
Bilirubin, UA: NEGATIVE
Glucose, UA: NEGATIVE
Ketones, UA: NEGATIVE
Leukocytes,UA: NEGATIVE
Nitrite, UA: NEGATIVE
RBC, UA: NEGATIVE
Specific Gravity, UA: 1.02 (ref 1.005–1.030)
Urobilinogen, Ur: 0.2 mg/dL (ref 0.2–1.0)
pH, UA: 7 (ref 5.0–7.5)

## 2021-01-10 LAB — BLADDER SCAN AMB NON-IMAGING: Scan Result: 0

## 2021-01-10 MED ORDER — SILODOSIN 8 MG PO CAPS: 8.0000 mg | ORAL_CAPSULE | Freq: Every day | ORAL | 11 refills | Status: DC

## 2021-01-10 NOTE — Progress Notes (Signed)
01/10/2021 10:56 AM   Chris [name]. 08/29/1945 [id number]  Referring provider: Rory Percy, MD [place],  [place] [address]  Followup BPH, nephrolithiasis and elevated PSA   HPI: Mr Taniguchi is a 75yo here for followup for BPH, Nephrolithiasis, and elevated PSA. PSA increased to 10.5 from 5.6 but is down from 14.4. No stone events since last visit. Renal US from 9/20 showed no calculi. IPSS 23 QOL 3. He is on rapaflo 8mg  daily. He is not bothered by his LUTS. Urine stream strong. Nocturia 2-3x. NO other complaints today   PMH: Past Medical History:  Diagnosis Date   Anxiety    Chest pain, unspecified    Depression    Dyslexia    Hearing loss    Attributed to recurring sinus infections   Seasonal allergies    Stroke ([place])    Right lateral temporal lobe   Unspecified essential hypertension     Surgical History: Past Surgical History:  Procedure Laterality Date   HIP ARTHROPLASTY     TOTAL KNEE ARTHROPLASTY      Home Medications:  Allergies as of 01/10/2021   No Known Allergies      Medication List        Accurate as of January 10, 2021 10:56 AM. If you have any questions, ask your nurse or doctor.          acetaminophen 325 MG tablet Commonly known as: TYLENOL Take 650 mg by mouth every 6 (six) hours as needed.   BC HEADACHE PO Take by mouth.   buPROPion 150 MG 24 hr tablet Commonly known as: WELLBUTRIN XL   busPIRone 15 MG tablet Commonly known as: BUSPAR Take 15 mg by mouth 3 (three) times daily.   desvenlafaxine 100 MG 24 hr tablet Commonly known as: PRISTIQ   donepezil 10 MG tablet Commonly known as: ARICEPT Take 1 tablet daily   fluticasone 50 MCG/ACT nasal spray Commonly known as: FLONASE   gabapentin 100 MG capsule Commonly known as: Neurontin Take 1 capsule every night   ibuprofen 200 MG tablet Commonly known as: ADVIL Take 200 mg by mouth every 6 (six) hours as needed.   losartan 25 MG tablet Commonly known  as: COZAAR Take 25 mg by mouth daily.   Meclizine HCl 25 MG Chew Chew by mouth.   Melatonin 10 MG Caps Take 20 mg by mouth at bedtime.   memantine 10 MG tablet Commonly known as: Namenda Take 1 tablet twice a day   montelukast 10 MG tablet Commonly known as: SINGULAIR   Mucinex Sinus-Max 5-10-200-325 MG Tabs Generic drug: Phenylephrine-DM-GG-APAP Take by mouth.   Multivital tablet Take 1 tablet by mouth daily.   silodosin 8 MG Caps capsule Commonly known as: RAPAFLO Take 1 capsule (8 mg total) by mouth daily with breakfast.   vitamin B-12 100 MCG tablet Commonly known as: CYANOCOBALAMIN Take 100 mcg by mouth daily.        Allergies: No Known Allergies  Family History: Family History  Problem Relation Age of Onset   Heart disease Mother    Cancer Father     Social History:  reports that he quit smoking about 51 years ago. He has a 1.00 pack-year smoking history. He has never used smokeless tobacco. He reports current alcohol use. He reports that he does not use drugs.  ROS: All other review of systems were reviewed and are negative except what is noted above in HPI  Physical Exam: BP Marland Kitchen)  142/95   Pulse 80   Wt 176 lb (79.8 kg)   BMI 26.76 kg/m   Constitutional:  Alert and oriented, No acute distress. HEENT: [place] AT, moist mucus membranes.  Trachea midline, no masses. Cardiovascular: No clubbing, cyanosis, or edema. Respiratory: Normal respiratory effort, no increased work of breathing. GI: Abdomen is soft, nontender, nondistended, no abdominal masses GU: No CVA tenderness.  Lymph: No cervical or inguinal lymphadenopathy. Skin: No rashes, bruises or suspicious lesions. Neurologic: Grossly intact, no focal deficits, moving all 4 extremities. Psychiatric: Normal mood and affect.  Laboratory Data: Lab Results  Component Value Date   WBC 5.7 01/18/2008   HGB 13.8 01/18/2008   HCT 39.8 01/18/2008   MCV 92.6 01/18/2008   PLT 159 01/18/2008    Lab  Results  Component Value Date   CREATININE 0.88 01/18/2008    No results found for: PSA  No results found for: TESTOSTERONE  No results found for: HGBA1C  Urinalysis    Component Value Date/Time   APPEARANCEUR Clear 07/09/2020 1203   GLUCOSEU Negative 07/09/2020 1203   BILIRUBINUR Negative 07/09/2020 1203   PROTEINUR Negative 07/09/2020 1203   UROBILINOGEN 0.2 08/04/2019 1340   NITRITE Negative 07/09/2020 1203   LEUKOCYTESUR Negative 07/09/2020 1203    Lab Results  Component Value Date   LABMICR See below: 07/09/2020   WBCUA None seen 07/09/2020   LABEPIT None seen 07/09/2020   MUCUS Present 02/04/2020   BACTERIA None seen 07/09/2020    Pertinent Imaging: Renal US 01/04/2021: Images reviewed and discussed with the patient  No results found for this or any previous visit.  No results found for this or any previous visit.  No results found for this or any previous visit.  No results found for this or any previous visit.  Results for orders placed during the hospital encounter of 01/04/21  Ultrasound renal complete  Narrative CLINICAL DATA:  Nephrolithiasis.  EXAM: RENAL / URINARY TRACT ULTRASOUND COMPLETE  COMPARISON:  January 22, 2020.  FINDINGS: Right Kidney:  Renal measurements: 9.0 x 5.6 x 4.4 cm = volume: 115 mL. Echogenicity within normal limits. No mass or hydronephrosis visualized.  Left Kidney:  Renal measurements: 11.0 x 5.6 x 4.9 cm = volume: 158 mL. Echogenicity within normal limits. No mass or hydronephrosis visualized.  Bladder:  Not well distended and therefore not well visualized. Patient reportedly voided prior to exam.  Other:  None.  IMPRESSION: No renal abnormality is noted.   Electronically Signed By: Marijo Conception M.D. On: 01/05/2021 09:59  No results found for this or any previous visit.  No results found for this or any previous visit.  No results found for this or any previous visit.   Assessment & Plan:     1. Benign prostatic hyperplasia with urinary obstruction -Continue rapaflo 8mg  daily - BLADDER SCAN AMB NON-IMAGING - Urinalysis, Routine w reflex microscopic  2. Elevated PSA -RTC 6 months with PSA  3. Nocturia -Continue rapaflo 8mg  daily  4. Nephrolithiasis -RTC 1 year with renal US   No follow-ups on file.  Nicolette Bang, MD  [facility] [place]

## 2021-01-10 NOTE — Patient Instructions (Signed)
Prostate-Specific Antigen Test Why am I having this test? The prostate-specific antigen (PSA) test is a screening test for prostate cancer. It can identify early signs of prostate cancer, which may allow for more effective treatment. Your health care provider may recommend that you have a PSA test starting at age 77 or that you have one earlier or later, depending on your risk factors for prostate cancer. You may also have a PSA test: To monitor treatment of prostate cancer. To check whether prostate cancer has returned after treatment. If you have signs of other conditions that can affect PSA levels, such as: An enlarged prostate that is not caused by cancer (benign prostatic hyperplasia, BPH). This condition is very common in older men. A prostate infection. What is being tested? This test measures the amount of PSA in your blood. PSA is a protein that is made in the prostate. The prostate naturally produces more PSA as you age, but very high levels may be a sign of a medical condition. What kind of sample is taken? A blood sample is required for this test. It is usually collected by inserting a needle into a blood vessel or by sticking a finger with a small needle. Blood for this test should be drawn before having an exam of the prostate. How do I prepare for this test? Do not ejaculate starting 24 hours before your test, or as long as told by your health care provider. Tell a health care provider about: Any allergies you have. All medicines you are taking, including vitamins, herbs, eye drops, creams, and over-the-counter medicines. This also includes: Medicines to assist with hair growth, such as finasteride. Any recent exposure to a medicine called diethylstilbestrol. Any blood disorders you have. Any recent procedures you have had, especially any procedures involving the prostate or rectum. Any medical conditions you have. Any recent urinary tract infections (UTIs) you have had. How are  the results reported? Your test results will be reported as a value that indicates how much PSA is in your blood. This will be given as nanograms of PSA per milliliter of blood (ng/mL). Your health care provider will compare your results to normal ranges that were established after testing a large group of people (reference ranges). Reference ranges may vary among labs and hospitals. PSA levels vary from person to person and generally increase with age. Because of this variation, there is no single PSA value that is considered normal for everyone. Instead, PSA reference ranges are used to describe whether your PSA levels are considered low or high (elevated). Common reference ranges are: Low: 0-2.5 ng/mL. Slightly to moderately elevated: 2.6-10.0 ng/mL. Moderately elevated: 10.0-19.9 ng/mL. Significantly elevated: 20 ng/mL or greater. Sometimes, the test results may report that a condition is present when it is not present (false-positive result). What do the results mean? A test result that is higher than 4 ng/mL may mean that you are at an increased risk for prostate cancer. However, a PSA test by itself is not enough to diagnose prostate cancer. High PSA levels may also be caused by the natural aging process, prostate infection, or BPH. PSA screening cannot tell you if your PSA is high due to cancer or a different cause. A prostate biopsy is the only way to diagnose prostate cancer. A risk of having the PSA test is diagnosing and treating prostate cancer that would never have caused any symptoms or problems (overdiagnosis and overtreatment). Talk with your health care provider about what your results mean. Questions  to ask your health care provider Ask your health care provider, or the department that is doing the test: When will my results be ready? How will I get my results? What are my treatment options? What other tests do I need? What are my next steps? Summary The prostate-specific  antigen (PSA) test is a screening test for prostate cancer. Your health care provider may recommend that you have a PSA test starting at age 77 or that you have one earlier or later, depending on your risk factors for prostate cancer. A test result that is higher than 4 ng/mL may mean that you are at an increased risk for prostate cancer. However, elevated levels can be caused by a number of conditions other than prostate cancer. Talk with your health care provider about what your results mean. This information is not intended to replace advice given to you by your health care provider. Make sure you discuss any questions you have with your health care provider. Document Revised: 12/18/2019 Document Reviewed: 12/18/2019 Elsevier Patient Education  2022 [employer].

## 2021-01-10 NOTE — Progress Notes (Signed)
Urological Symptom Review  PVR 0 Patient is experiencing the following symptoms: Get up at night to urinate Leakage of urine Stream starts and stops Trouble starting stream   Review of Systems  Gastrointestinal (upper)  : Negative for upper GI symptoms  Gastrointestinal (lower) : Negative for lower GI symptoms  Constitutional : Negative for symptoms  Skin: Negative for skin symptoms  Eyes: Negative for eye symptoms  Ear/Nose/Throat : Sore throat Sinus problems  Hematologic/Lymphatic: Negative for Hematologic/Lymphatic symptoms  Cardiovascular : Negative for cardiovascular symptoms  Respiratory : Negative for respiratory symptoms  Endocrine: Negative for endocrine symptoms  Musculoskeletal: Negative for musculoskeletal symptoms  Neurological: Headaches Dizziness  Psychologic: Depression Anxiety

## 2021-01-18 NOTE — Progress Notes (Signed)
Sent via mail and my chart

## 2021-01-24 ENCOUNTER — Ambulatory Visit: Payer: Medicare PPO | Admitting: Urology

## 2021-02-28 DIAGNOSIS — F419 Anxiety disorder, unspecified: Secondary | ICD-10-CM | POA: Diagnosis not present

## 2021-02-28 DIAGNOSIS — F332 Major depressive disorder, recurrent severe without psychotic features: Secondary | ICD-10-CM | POA: Diagnosis not present

## 2021-02-28 DIAGNOSIS — G3184 Mild cognitive impairment, so stated: Secondary | ICD-10-CM | POA: Diagnosis not present

## 2021-03-07 DIAGNOSIS — J310 Chronic rhinitis: Secondary | ICD-10-CM | POA: Diagnosis not present

## 2021-03-07 DIAGNOSIS — T485X5A Adverse effect of other anti-common-cold drugs, initial encounter: Secondary | ICD-10-CM | POA: Diagnosis not present

## 2021-03-16 ENCOUNTER — Other Ambulatory Visit: Payer: Self-pay

## 2021-03-16 ENCOUNTER — Ambulatory Visit: Payer: Medicare PPO | Admitting: Physician Assistant

## 2021-03-16 VITALS — BP 97/61 | HR 80 | Resp 18 | Ht 68.0 in | Wt 188.0 lb

## 2021-03-16 DIAGNOSIS — F0390 Unspecified dementia without behavioral disturbance: Secondary | ICD-10-CM

## 2021-03-16 NOTE — Progress Notes (Addendum)
Assessment/Plan:    Major neurocognitive disorder, mild, likely multifactorial, vascular and Alzheimer's disease.   Recommendations:   Discussed safety both in and out of the home.  Discussed the importance of regular daily schedule with inclusion of crossword puzzles to maintain brain function.  Stay active at least 30 minutes at least 3 times a week.  Naps should be scheduled and should be no longer than 60 minutes and should not occur after 2 PM.  Mediterranean diet is recommended  Continue donepezil 10 mg daily Side effects were discussed. Will consider increasing donepezil to 23 mg daily after MMSE during next visit  Continue Memantine 10 mg twice daily.Side effects were discussed  Continue Gabapentin 100 mg qhs for headaches Follow up with Psych and continue mood meds by Dr. Alroy Dust as scheduled   Follow up as scheduled on 09/14/21 with Dr. Delice Lesch    Case discussed with Dr. Tomi Likens who agrees with the plan     Subjective:   Chris [name]. is a 75 y.o.  RH male with a history of diet-controlled hypertension, depression, anxiety, with Neuropsychological testing in 02/2019 indicating Major Neurocognitive Disorder (ie dementia), mild, likely multifactorial, vascular and Alzheimer's disease. He is seen today in follow up for memory loss.  He was last seen at our office on 09/10/2020, at which time MoCA was 25/30.  MRI of the brain without acute changes, but with note of temporal lobe atrophy as well as chronic microhemorrhages.  This patient is accompanied in the office by his wife who supplements the history.  Previous records as well as any outside records available were reviewed prior to todays visit.  Patient is currently on donepezil 10 mg daily and memantine 10 mg twice daily. Since his last visit, he feels that his long-term memory may be more affected than his short-term memory.  He continues to have trouble remembering names, or people that he knows are not recently  recognized.  He continues to have some trouble finding words.  His wife manages the finances, medications, driving, and meals.  He sleeps until late in the morning, eats breakfast, then goes for a walk, or do some word finding, and in the afternoon he will watch TV at which time he "dozes off ".  He denies any hallucinations, or paranoia.  Sometimes, during the night he has been by dreams, but denies any REM behavior.  During the day, his wife reports that he may be talking out loud to himself.  No other personality changes.  He denies any falls.  He ambulates without a walker or a cane.  He is independent of dressing and bathing.  During the last visit, his daily headaches were treated with gabapentin 100 mg nightly, which seems to help.  He denies any side effects of the medicine.  He has a history of urinary urgency, recently started on a medication by urology, Rapaflo which seems to help.  He refuses to wear diapers.  No diarrhea or constipation.    History on Initial Assessment 05/30/2018: This is a 75 year old right-handed man with a history of diet-controlled hypertension, depression, anxiety, presenting for evaluation of worsening memory. He states his memory is good and bad at certain things, she cannot recall what he did yesterday, he would not recall the date and have to look at his calendar. He denies getting lost driving. His wife occasionally reminds him to take his medications. He has frequent headaches and wants to take over the counter medication all  day long. His wife manages bills. His wife states he has never been one to remember names, but in the past 6 months, she has noticed he would have difficulty thinking of the name of an object and get dates and times confused. He would ask her 5 times in one day what time his doctor appointment was. She denies any significant driving concerns. She has noticed that he gets really nervous and shaky around 2pm, then calms down by 8pm. Sleep is good, no  wandering behavior. He still feels drowsy during the day. No paranoia or hallucinations. They report he had failed several grades in school, joined the [employer], then went to [employer] where he had a GPA of 3.85. It was in college where he was formally diagnosed with dyslexia. 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 There is no family history of dementia. No history of significant head injuries. He drinks 1 big can of beer a week, sometimes he drinks 3-4 times a week.    He has had daily headaches for several years and saw neurologist Dr. Domingo Cocking in 2018. He had trigger point injections and became headache-free for a while, until he had a fall with loss of consciousness last September 2019 and headaches recurred. He has a moderate ache in the frontal region constantly, no nausea/vomiting, photo/phonophobia. He has been taking Tylenol and BC powders but states he does not take it quite everyday. He has dizziness with lightheadedness sometimes lasting all day until he takes meclizine which helps. He denies any diplopia, dysarthria/dysphagia, back pain, bladder dysfunction, anosmia. He has occasional neck pain and constipation. He started noticing bilateral hand tremors 2-3 years ago which do not affect writing or using utensils. He fell while walking on a trail last September, he recalls starting feeling bad in his stomach and started walking faster, then passed out. He feels he was only out for a second, he does not remember hitting the ground but stood up and realized he had fallen down. He had significant injury on the left eyelid and left side of his face and underwent plastic surgery. He still has stitches on the left lateral eyelid. He has occasional numbness and tingling in his right hand, it feels cold sometimes. He has noticed difficulty buttoning or tying shoelaces with his right hand. His wife has noticed shuffling when walking over the past year.   I personally reviewed MRI  brain without contrast done 11/27/2016 which did not show any acute changes. There was scattered foci of susceptibility are present over the posterior left temporal lobe and occipital lobe, increased dural vasculature is suggested on axial image 8 of series 7. 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 There was moderate diffuse atrophy and mild chronic microvascular disease   I personally reviewed MRI brain with and without contrast done February 2020 which did not show any acute changes.There was stable small chronic infarct within the right lateral temporal lobe, stable mild chronic microvascular disease, moderate diffuse volume loss with prominent volume loss in the anteromedial temporal lobes and parahippocampal gyri (which can be seen with Alzheimer's or frontotemporal lobar degeneration). There were numerous foci of chronic microhemorrhages predominantly in the posterior distribution, greater on the left (favoring amyloid angiopathy).    Neuropsychological testing in 02/2019 showed Major Neurocognitive Disorder (dementia), mild end of spectrum. Etiology likely multifactorial, vascular and Alzheimer's disease.    TSH and B12 unremarkable  PREVIOUS MEDICATIONS:   CURRENT MEDICATIONS:  Outpatient Encounter Medications as  of 03/16/2021  Medication Sig   acetaminophen (TYLENOL) 325 MG tablet Take 650 mg by mouth every 6 (six) hours as needed.   Aspirin-Salicylamide-Caffeine (BC HEADACHE PO) Take by mouth.   buPROPion (WELLBUTRIN XL) 150 MG 24 hr tablet    busPIRone (BUSPAR) 15 MG tablet Take 15 mg by mouth 3 (three) times daily.    desvenlafaxine (PRISTIQ) 100 MG 24 hr tablet    donepezil (ARICEPT) 10 MG tablet Take 1 tablet daily   fluticasone (FLONASE) 50 MCG/ACT nasal spray    gabapentin (NEURONTIN) 100 MG capsule Take 1 capsule every night   ibuprofen (ADVIL) 200 MG tablet Take 200 mg by mouth every 6 (six) hours as needed.   losartan (COZAAR) 25 MG tablet Take 25 mg by mouth  daily.   Meclizine HCl 25 MG CHEW Chew by mouth.   Melatonin 10 MG CAPS Take 20 mg by mouth at bedtime.   memantine (NAMENDA) 10 MG tablet Take 1 tablet twice a day   montelukast (SINGULAIR) 10 MG tablet    Multiple Vitamins-Minerals (MULTIVITAL) tablet Take 1 tablet by mouth daily.   Phenylephrine-DM-GG-APAP (MUCINEX SINUS-MAX) 5-10-200-325 MG TABS Take by mouth.   silodosin (RAPAFLO) 8 MG CAPS capsule Take 1 capsule (8 mg total) by mouth daily with breakfast.   vitamin B-12 (CYANOCOBALAMIN) 100 MCG tablet Take 100 mcg by mouth daily.   No facility-administered encounter medications on file as of 03/16/2021.     Objective:     PHYSICAL EXAMINATION:    VITALS:   Vitals:   03/16/21 1102  BP: 97/61  Pulse: 80  Resp: 18  SpO2: 96%  Weight: 188 lb (85.3 kg)  Height: 5\' 8"  (1.727 m)    GEN:  The patient appears stated age and is in NAD. HEENT:  Normocephalic, atraumatic.   Neurological examination:  General: NAD, well-groomed, appears stated age. Orientation: The patient is alert. Oriented to person, place and date Cranial nerves: There is good facial symmetry.The speech is fluent and clear. No aphasia or dysarthria. Fund of knowledge is appropriate. Recent and remote memory are impaired. Attention and concentration are reduced.  Able to name objects and repeat phrases.  Hearing is intact to conversational tone.    Sensation: Sensation is intact to light touch throughout Motor: Strength is at least antigravity x4. Tremors: none  DTR's 2/4 in [place] Cognitive Assessment  12/16/2018 05/30/2018  Visuospatial/ Executive (0/5) 3 5  Naming (0/3) 2 1  Attention: Read list of digits (0/2) 2 1  Attention: Read list of letters (0/1) 1 1  Attention: Serial 7 subtraction starting at 100 (0/3) 1 2  Language: Repeat phrase (0/2) 0 0  Language : Fluency (0/1) 0 1  Abstraction (0/2) 0 0  Delayed Recall (0/5) 0 0  Orientation (0/6) 2 4  Total 11 15   MMSE - Mini Mental  State Exam 09/10/2020  Orientation to time 4  Orientation to Place 5  Registration 3  Attention/ Calculation 4  Recall 2  Language- name 2 objects 2  Language- repeat 1  Language- follow 3 step command 1  Language- read & follow direction 1  Write a sentence 1  Copy design 1  Total score 25    No flowsheet data found.     Movement examination: Tone: There is normal tone in the UE/LE Abnormal movements:  no tremor.  No myoclonus.  No asterixis.   Coordination:  There is no decremation with RAM's. Normal finger to nose  Gait and Station: The patient has no difficulty arising out of a deep-seated chair without the use of the hands. The patient's stride length is good.  Gait is cautious and narrow.     Total time spent on today's visit was 30  minutes, including both face-to-face time and nonface-to-face time. Time included that spent on review of records (prior notes available to me/labs/imaging if pertinent), discussing treatment and goals, answering patient's questions and coordinating care.  Cc:  Lanelle Bal, PA-C Sharene Butters, PA-C

## 2021-03-16 NOTE — Patient Instructions (Signed)
Good to see you!  1. Continue Donepezil 10mg  daily and Memantine 10mg  twice a day  2. Continue Gabapentin 100mg  every night  3. Follow-up as scheduled  call for any changes   FALL PRECAUTIONS: Be cautious when walking. Scan the area for obstacles that may increase the risk of trips and falls. When getting up in the mornings, sit up at the edge of the bed for a few minutes before getting out of bed. Consider elevating the bed at the head end to avoid drop of blood pressure when getting up. Walk always in a well-lit room (use night lights in the walls). Avoid area rugs or power cords from appliances in the middle of the walkways. Use a walker or a cane if necessary and consider physical therapy for balance exercise. Get your eyesight checked regularly.   HOME SAFETY: Consider the safety of the kitchen when operating appliances like stoves, microwave oven, and blender. Consider having supervision and share cooking responsibilities until no longer able to participate in those. Accidents with firearms and other hazards in the house should be identified and addressed as well.  ABILITY TO BE LEFT ALONE: If patient is unable to contact 911 operator, consider using LifeLine, or when the need is there, arrange for someone to stay with patients. Smoking is a fire hazard, consider supervision or cessation. Risk of wandering should be assessed by caregiver and if detected at any point, supervision and safe proof recommendations should be instituted.  RECOMMENDATIONS FOR ALL PATIENTS WITH MEMORY PROBLEMS: 1. Continue to exercise (Recommend 30 minutes of walking everyday, or 3 hours every week) 2. Increase social interactions - continue going to [place] and enjoy social gatherings with friends and family 3. Eat healthy, avoid fried foods and eat more fruits and vegetables 4. Maintain adequate blood pressure, blood sugar, and blood cholesterol level. Reducing the risk of stroke and cardiovascular disease also  helps promoting better memory. 5. Avoid stressful situations. Live a simple life and avoid aggravations. Organize your time and prepare for the next day in anticipation. 6. Sleep well, avoid any interruptions of sleep and avoid any distractions in the bedroom that may interfere with adequate sleep quality 7. Avoid sugar, avoid sweets as there is a strong link between excessive sugar intake, diabetes, and cognitive impairment The Mediterranean diet has been shown to help patients reduce the risk of progressive memory disorders and reduces cardiovascular risk. This includes eating fish, eat fruits and green leafy vegetables, nuts like almonds and hazelnuts, walnuts, and also use olive oil. Avoid fast foods and fried foods as much as possible. Avoid sweets and sugar as sugar use has been linked to worsening of memory function.  There is always a concern of gradual progression of memory problems. If this is the case, then we may need to adjust level of care according to patient needs. Support, both to the patient and caregiver, should then be put into place.

## 2021-04-12 DIAGNOSIS — R059 Cough, unspecified: Secondary | ICD-10-CM | POA: Diagnosis not present

## 2021-04-12 DIAGNOSIS — Z20828 Contact with and (suspected) exposure to other viral communicable diseases: Secondary | ICD-10-CM | POA: Diagnosis not present

## 2021-04-12 DIAGNOSIS — J019 Acute sinusitis, unspecified: Secondary | ICD-10-CM | POA: Diagnosis not present

## 2021-05-10 DIAGNOSIS — K146 Glossodynia: Secondary | ICD-10-CM | POA: Diagnosis not present

## 2021-05-10 DIAGNOSIS — Z6829 Body mass index (BMI) 29.0-29.9, adult: Secondary | ICD-10-CM | POA: Diagnosis not present

## 2021-05-10 DIAGNOSIS — R739 Hyperglycemia, unspecified: Secondary | ICD-10-CM | POA: Diagnosis not present

## 2021-05-10 DIAGNOSIS — K117 Disturbances of salivary secretion: Secondary | ICD-10-CM | POA: Diagnosis not present

## 2021-07-04 ENCOUNTER — Other Ambulatory Visit: Payer: Medicare PPO

## 2021-07-04 ENCOUNTER — Other Ambulatory Visit: Payer: Self-pay

## 2021-07-04 DIAGNOSIS — R972 Elevated prostate specific antigen [PSA]: Secondary | ICD-10-CM

## 2021-07-05 LAB — PSA: Prostate Specific Ag, Serum: 6.8 ng/mL — ABNORMAL HIGH (ref 0.0–4.0)

## 2021-07-11 ENCOUNTER — Ambulatory Visit: Payer: Medicare PPO | Admitting: Urology

## 2021-07-11 ENCOUNTER — Encounter: Payer: Self-pay | Admitting: Urology

## 2021-07-11 ENCOUNTER — Other Ambulatory Visit: Payer: Self-pay

## 2021-07-11 VITALS — BP 95/64 | HR 86

## 2021-07-11 DIAGNOSIS — N401 Enlarged prostate with lower urinary tract symptoms: Secondary | ICD-10-CM | POA: Diagnosis not present

## 2021-07-11 DIAGNOSIS — N138 Other obstructive and reflux uropathy: Secondary | ICD-10-CM

## 2021-07-11 DIAGNOSIS — R351 Nocturia: Secondary | ICD-10-CM | POA: Diagnosis not present

## 2021-07-11 DIAGNOSIS — R972 Elevated prostate specific antigen [PSA]: Secondary | ICD-10-CM | POA: Diagnosis not present

## 2021-07-11 LAB — URINALYSIS, ROUTINE W REFLEX MICROSCOPIC
Bilirubin, UA: NEGATIVE
Glucose, UA: NEGATIVE
Ketones, UA: NEGATIVE
Leukocytes,UA: NEGATIVE
Nitrite, UA: NEGATIVE
Protein,UA: NEGATIVE
RBC, UA: NEGATIVE
Specific Gravity, UA: >1.03 — ABNORMAL HIGH (ref 1.005–1.030)
Urobilinogen, Ur: 0.2 mg/dL (ref 0.2–1.0)
pH, UA: 5.5 (ref 5.0–7.5)

## 2021-07-11 MED ORDER — SILODOSIN 8 MG PO CAPS: 8.0000 mg | ORAL_CAPSULE | Freq: Every day | ORAL | 3 refills | Status: DC

## 2021-07-11 NOTE — Progress Notes (Signed)
? ?07/11/2021 ?10:34 AM  ? ?Chris [name]. ?10/01/1945 ?[id number] ? ?Referring provider: Rory Percy, MD ?[address] ?[place],  [place] [address] ? ?Followup elevated PSa and BPH ? ? ?HPI: ?[name] is a 76yo here for followup for elevated PSA and BPH. PSA decreased to 6.8 from 10.5.  ?IPSS 3 QOl 1 on rapaflo '8mg'$  qhs. Nocturia stable at 1-2x. No straining  to urinate. Urine stream strong. No dysuria or hematuria. No UTIs since last visit. No other complaints today ? ?PMH: ?Past Medical History:  ?Diagnosis Date  ? Anxiety   ? Chest pain, unspecified   ? Depression   ? Dyslexia   ? Hearing loss   ? Attributed to recurring sinus infections  ? Seasonal allergies   ? Stroke [facility])   ? Right lateral temporal lobe  ? Unspecified essential hypertension   ? ? ?Surgical History: ?Past Surgical History:  ?Procedure Laterality Date  ? HIP ARTHROPLASTY    ? TOTAL KNEE ARTHROPLASTY    ? ? ?Home Medications:  ?Allergies as of 07/11/2021   ?No Known Allergies ?  ? ?  ?Medication List  ?  ? ?  ? Accurate as of July 11, 2021 10:34 AM. If you have any questions, ask your nurse or doctor.  ?  ?  ? ?  ? ?acetaminophen 325 MG tablet ?Commonly known as: TYLENOL ?Take 650 mg by mouth every 6 (six) hours as needed. ?  ?BC HEADACHE PO ?Take by mouth. ?  ?buPROPion 150 MG 24 hr tablet ?Commonly known as: WELLBUTRIN XL ?  ?busPIRone 15 MG tablet ?Commonly known as: BUSPAR ?Take 15 mg by mouth 3 (three) times daily. ?  ?desvenlafaxine 100 MG 24 hr tablet ?Commonly known as: PRISTIQ ?  ?donepezil 10 MG tablet ?Commonly known as: ARICEPT ?Take 1 tablet daily ?  ?fluticasone 50 MCG/ACT nasal spray ?Commonly known as: FLONASE ?  ?gabapentin 100 MG capsule ?Commonly known as: Neurontin ?Take 1 capsule every night ?  ?ibuprofen 200 MG tablet ?Commonly known as: ADVIL ?Take 200 mg by mouth every 6 (six) hours as needed. ?  ?losartan 25 MG tablet ?Commonly known as: COZAAR ?Take 25 mg by mouth daily. ?  ?Meclizine HCl 25 MG Chew ?Chew by mouth. ?   ?Melatonin 10 MG Caps ?Take 20 mg by mouth at bedtime. ?  ?memantine 10 MG tablet ?Commonly known as: Namenda ?Take 1 tablet twice a day ?  ?montelukast 10 MG tablet ?Commonly known as: SINGULAIR ?  ?Mucinex Sinus-Max 5-10-200-325 MG Tabs ?Generic drug: Phenylephrine-DM-GG-APAP ?Take by mouth. ?  ?Multivital tablet ?Take 1 tablet by mouth daily. ?  ?silodosin 8 MG Caps capsule ?Commonly known as: RAPAFLO ?Take 1 capsule (8 mg total) by mouth daily with breakfast. ?  ?vitamin B-12 100 MCG tablet ?Commonly known as: CYANOCOBALAMIN ?Take 100 mcg by mouth daily. ?  ? ?  ? ? ?Allergies: No Known Allergies ? ?Family History: ?Family History  ?Problem Relation Age of Onset  ? Heart disease Mother   ? Cancer Father   ? ? ?Social History:  reports that he quit smoking about 51 years ago. He has a 1.00 pack-year smoking history. He has never used smokeless tobacco. He reports current alcohol use. He reports that he does not use drugs. ? ?ROS: ?All other review of systems were reviewed and are negative except what is noted above in HPI ? ?Physical Exam: ?BP 95/64   Pulse 86   ?Constitutional:  Alert and oriented, No acute  distress. ?HEENT: [place] AT, moist mucus membranes.  Trachea midline, no masses. ?Cardiovascular: No clubbing, cyanosis, or edema. ?Respiratory: Normal respiratory effort, no increased work of breathing. ?GI: Abdomen is soft, nontender, nondistended, no abdominal masses ?GU: No CVA tenderness.  ?Lymph: No cervical or inguinal lymphadenopathy. ?Skin: No rashes, bruises or suspicious lesions. ?Neurologic: Grossly intact, no focal deficits, moving all 4 extremities. ?Psychiatric: Normal mood and affect. ? ?Laboratory Data: ?Lab Results  ?Component Value Date  ? WBC 5.7 01/18/2008  ? HGB 13.8 01/18/2008  ? HCT 39.8 01/18/2008  ? MCV 92.6 01/18/2008  ? PLT 159 01/18/2008  ? ? ?Lab Results  ?Component Value Date  ? CREATININE 0.88 01/18/2008  ? ? ?No results found for: PSA ? ?No results found for: TESTOSTERONE ? ?No  results found for: HGBA1C ? ?Urinalysis ?   ?Component Value Date/Time  ? APPEARANCEUR Clear 01/10/2021 1112  ? GLUCOSEU Negative 01/10/2021 1112  ? BILIRUBINUR Negative 01/10/2021 1112  ? PROTEINUR Trace (A) 01/10/2021 1112  ? UROBILINOGEN 0.2 08/04/2019 1340  ? NITRITE Negative 01/10/2021 1112  ? LEUKOCYTESUR Negative 01/10/2021 1112  ? ? ?Lab Results  ?Component Value Date  ? LABMICR Comment 01/10/2021  ? [place] None seen 07/09/2020  ? LABEPIT None seen 07/09/2020  ? MUCUS Present 02/04/2020  ? BACTERIA None seen 07/09/2020  ? ? ?Pertinent Imaging: ? ?No results found for this or any previous visit. ? ?No results found for this or any previous visit. ? ?No results found for this or any previous visit. ? ?No results found for this or any previous visit. ? ?Results for orders placed during the hospital encounter of 01/04/21 ? ?Ultrasound renal complete ? ?Narrative ?CLINICAL DATA:  Nephrolithiasis. ? ?EXAM: ?RENAL / URINARY TRACT ULTRASOUND COMPLETE ? ?COMPARISON:  January 22, 2020. ? ?FINDINGS: ?Right Kidney: ? ?Renal measurements: 9.0 x 5.6 x 4.4 cm = volume: 115 mL. ?Echogenicity within normal limits. No mass or hydronephrosis ?visualized. ? ?Left Kidney: ? ?Renal measurements: 11.0 x 5.6 x 4.9 cm = volume: 158 mL. ?Echogenicity within normal limits. No mass or hydronephrosis ?visualized. ? ?Bladder: ? ?Not well distended and therefore not well visualized. Patient ?reportedly voided prior to exam. ? ?Other: ? ?None. ? ?IMPRESSION: ?No renal abnormality is noted. ? ? ?Electronically Signed ?By: Marijo Conception M.D. ?On: 01/05/2021 09:59 ? ?No results found for this or any previous visit. ? ?No results found for this or any previous visit. ? ?No results found for this or any previous visit. ? ? ?Assessment & Plan:   ? ?1. Benign prostatic hyperplasia with urinary obstruction ?Continue rapaflo '8mg'$   ?- Urinalysis, Routine w reflex microscopic ? ?2. Elevated PSA ?-RTC 6 months with a PSA ?- Urinalysis, Routine w reflex  microscopic ? ?3. Nocturia ?--Continue rapaflo '8mg'$  qhs ? ? ?No follow-ups on file. ? ?Nicolette Bang, MD ? ?[facility] ?  ?

## 2021-07-11 NOTE — Patient Instructions (Signed)
Prostate-Specific Antigen Test ?Why am I having this test? ?The prostate-specific antigen (PSA) test is a screening test for prostate cancer. It can identify early signs of prostate cancer, which may allow for early detection and more effective treatment. Your health care provider may recommend that you have a PSA test starting at age 78 or that you have one earlier if you are at higher risk for prostate cancer. You may also have a PSA test: ?To monitor treatment of prostate cancer. ?To check whether prostate cancer has returned after treatment. ?What is being tested? ?This test measures the amount of PSA in your blood. PSA is a protein that is made in the prostate. The prostate naturally produces more PSA as you age, but very high levels may be a sign of a medical condition. ?What kind of sample is taken? ?A blood sample is required for this test. It is usually collected by inserting a needle into a blood vessel but can also be collected by sticking a finger with a small needle. Blood for this test should be drawn before having an exam of the prostate that involves digital rectal examination to avoid affecting the results. ?How do I prepare for this test? ?Do not ejaculate starting 24 hours before your test, or as long as told by your health care provider, as this can cause an elevation in PSA. ?Do not undergo any procedures that require manipulation of the prostate, such as biopsy or surgery, for 6 weeks before the test is done as this can cause an elevation in PSA. ?Tell a health care provider about: ?Any signs you may have of other conditions that can affect PSA levels, such as: ?An enlarged prostate that is not caused by cancer (benign prostatic hyperplasia, or BPH). This condition is very common in older men. ?A prostate or urinary tract infection. ?Any allergies you have. ?All medicines you are taking, including vitamins, herbs, eye drops, creams, and over-the-counter medicines. This also includes: ?Medicines  to assist with hair growth, such as finasteride. ?Any recent exposure to a medicine called diethylstilbestrol (DES). ?Medicines such as male hormones (like testosterone) or other medicines that raise testosterone levels. ?Any bleeding problems you have. ?Any recent procedures you have had, especially any procedures involving the prostate or rectum. ?Any medical conditions you have. ?How are the results reported? ?Your test results will be reported as a value that indicates how much PSA is in your blood. This will be given as nanograms of PSA per milliliter of blood (ng/mL). Your health care provider will compare your results to normal ranges that were established after testing a large group of people (reference ranges). Reference ranges may vary among labs and hospitals. ?PSA levels vary from person to person and generally increase with age. Because of this variation, there is no single PSA value that is considered normal for everyone. Instead, PSA reference ranges are used to describe whether your PSA levels are considered low or high (elevated). Common reference ranges are: ?Low: 0-2.5 ng/mL. ?Slightly to moderately elevated: 2.6-10.0 ng/mL. ?Moderately elevated: 10.0-19.9 ng/mL. ?Significantly elevated: 20 ng/mL or greater. ?What do the results mean? ?A test result that is higher than 4 ng/mL may mean that you have prostate cancer. However, a PSA test by itself is not enough to diagnose prostate cancer. High PSA levels may also be caused by the natural aging process, prostate infection (prostatitis), or BPH. ?PSA screening cannot tell you if your PSA is high due to cancer or a different cause. ?A prostate biopsy  is the only way to diagnose prostate cancer. ?A risk of having the PSA test is diagnosing and treating prostate cancer that would never have caused any symptoms or problems (overdiagnosis and overtreatment). ?Talk with your health care provider about what your results mean. In some cases, your health care  provider may do more testing to confirm the results. ?Questions to ask your health care provider ?Ask your health care provider, or the department that is doing the test: ?When will my results be ready? ?How will I get my results? ?What are my treatment options? ?What other tests do I need? ?What are my next steps? ?Summary ?The prostate-specific antigen (PSA) test is a screening test for prostate cancer. ?Your health care provider may recommend that you have a PSA test starting at age 83 or that you have one earlier if you are at higher risk for prostate cancer. ?A test result that is higher than 4 ng/mL may mean that you have prostate cancer. However, elevated levels can be caused by a number of conditions other than prostate cancer. ?Talk with your health care provider about what your results mean. ?This information is not intended to replace advice given to you by your health care provider. Make sure you discuss any questions you have with your health care provider. ?Document Revised: 08/11/2020 Document Reviewed: 08/11/2020 ?Elsevier Patient Education ? [place]. ? ?

## 2021-08-01 DIAGNOSIS — R739 Hyperglycemia, unspecified: Secondary | ICD-10-CM | POA: Diagnosis not present

## 2021-08-01 DIAGNOSIS — I10 Essential (primary) hypertension: Secondary | ICD-10-CM | POA: Diagnosis not present

## 2021-08-01 DIAGNOSIS — Z131 Encounter for screening for diabetes mellitus: Secondary | ICD-10-CM | POA: Diagnosis not present

## 2021-08-09 DIAGNOSIS — F411 Generalized anxiety disorder: Secondary | ICD-10-CM | POA: Diagnosis not present

## 2021-08-09 DIAGNOSIS — Z6828 Body mass index (BMI) 28.0-28.9, adult: Secondary | ICD-10-CM | POA: Diagnosis not present

## 2021-08-09 DIAGNOSIS — J309 Allergic rhinitis, unspecified: Secondary | ICD-10-CM | POA: Diagnosis not present

## 2021-08-09 DIAGNOSIS — I10 Essential (primary) hypertension: Secondary | ICD-10-CM | POA: Diagnosis not present

## 2021-08-09 DIAGNOSIS — K117 Disturbances of salivary secretion: Secondary | ICD-10-CM | POA: Diagnosis not present

## 2021-08-12 DIAGNOSIS — I10 Essential (primary) hypertension: Secondary | ICD-10-CM | POA: Diagnosis not present

## 2021-08-12 DIAGNOSIS — K146 Glossodynia: Secondary | ICD-10-CM | POA: Diagnosis not present

## 2021-08-12 DIAGNOSIS — J302 Other seasonal allergic rhinitis: Secondary | ICD-10-CM | POA: Diagnosis not present

## 2021-08-12 DIAGNOSIS — J019 Acute sinusitis, unspecified: Secondary | ICD-10-CM | POA: Diagnosis not present

## 2021-08-12 DIAGNOSIS — J029 Acute pharyngitis, unspecified: Secondary | ICD-10-CM | POA: Diagnosis not present

## 2021-08-12 DIAGNOSIS — K117 Disturbances of salivary secretion: Secondary | ICD-10-CM | POA: Diagnosis not present

## 2021-08-12 DIAGNOSIS — Z6828 Body mass index (BMI) 28.0-28.9, adult: Secondary | ICD-10-CM | POA: Diagnosis not present

## 2021-08-25 DIAGNOSIS — F419 Anxiety disorder, unspecified: Secondary | ICD-10-CM | POA: Diagnosis not present

## 2021-08-25 DIAGNOSIS — F332 Major depressive disorder, recurrent severe without psychotic features: Secondary | ICD-10-CM | POA: Diagnosis not present

## 2021-08-25 DIAGNOSIS — G3184 Mild cognitive impairment, so stated: Secondary | ICD-10-CM | POA: Diagnosis not present

## 2021-08-29 DIAGNOSIS — F419 Anxiety disorder, unspecified: Secondary | ICD-10-CM | POA: Diagnosis not present

## 2021-08-29 DIAGNOSIS — Z79899 Other long term (current) drug therapy: Secondary | ICD-10-CM | POA: Diagnosis not present

## 2021-08-29 DIAGNOSIS — G3184 Mild cognitive impairment, so stated: Secondary | ICD-10-CM | POA: Diagnosis not present

## 2021-08-29 DIAGNOSIS — F332 Major depressive disorder, recurrent severe without psychotic features: Secondary | ICD-10-CM | POA: Diagnosis not present

## 2021-09-05 ENCOUNTER — Other Ambulatory Visit: Payer: Self-pay | Admitting: Neurology

## 2021-09-05 DIAGNOSIS — F03A0 Unspecified dementia, mild, without behavioral disturbance, psychotic disturbance, mood disturbance, and anxiety: Secondary | ICD-10-CM

## 2021-09-12 ENCOUNTER — Other Ambulatory Visit: Payer: Self-pay | Admitting: Neurology

## 2021-09-14 ENCOUNTER — Encounter: Payer: Self-pay | Admitting: Neurology

## 2021-09-14 ENCOUNTER — Ambulatory Visit: Payer: Medicare PPO | Admitting: Neurology

## 2021-09-14 VITALS — BP 143/91 | HR 88 | Ht 68.0 in | Wt 183.0 lb

## 2021-09-14 DIAGNOSIS — F03A0 Unspecified dementia, mild, without behavioral disturbance, psychotic disturbance, mood disturbance, and anxiety: Secondary | ICD-10-CM

## 2021-09-14 DIAGNOSIS — R519 Headache, unspecified: Secondary | ICD-10-CM

## 2021-09-14 DIAGNOSIS — F0390 Unspecified dementia without behavioral disturbance: Secondary | ICD-10-CM | POA: Diagnosis not present

## 2021-09-14 MED ORDER — MEMANTINE HCL 10 MG PO TABS: ORAL_TABLET | ORAL | 3 refills | Status: DC

## 2021-09-14 MED ORDER — GABAPENTIN 100 MG PO CAPS: ORAL_CAPSULE | ORAL | 3 refills | Status: DC

## 2021-09-14 MED ORDER — DONEPEZIL HCL 10 MG PO TABS: ORAL_TABLET | ORAL | 3 refills | Status: DC

## 2021-09-14 NOTE — Patient Instructions (Signed)
Good to see you. Hopefully we can get you feeling a little better  Increase Gabapentin 100mg: Take 1 capsule twice a day  2. Continue Donepezil 10mg daily and Memantine 10mg twice a day  3. Increase physical activity   4. Discuss mood with psychiatrist  5. Follow-up in 6 months, call for any changes   FALL PRECAUTIONS: Be cautious when walking. Scan the area for obstacles that may increase the risk of trips and falls. When getting up in the mornings, sit up at the edge of the bed for a few minutes before getting out of bed. Consider elevating the bed at the head end to avoid drop of blood pressure when getting up. Walk always in a well-lit room (use night lights in the walls). Avoid area rugs or power cords from appliances in the middle of the walkways. Use a walker or a cane if necessary and consider physical therapy for balance exercise. Get your eyesight checked regularly.  HOME SAFETY: Consider the safety of the kitchen when operating appliances like stoves, microwave oven, and blender. Consider having supervision and share cooking responsibilities until no longer able to participate in those. Accidents with firearms and other hazards in the house should be identified and addressed as well.  ABILITY TO BE LEFT ALONE: If patient is unable to contact 911 operator, consider using LifeLine, or when the need is there, arrange for someone to stay with patients. Smoking is a fire hazard, consider supervision or cessation. Risk of wandering should be assessed by caregiver and if detected at any point, supervision and safe proof recommendations should be instituted.   RECOMMENDATIONS FOR ALL PATIENTS WITH MEMORY PROBLEMS: 1. Continue to exercise (Recommend 30 minutes of walking everyday, or 3 hours every week) 2. Increase social interactions - continue going to [employer] and enjoy social gatherings with friends and family 3. Eat healthy, avoid fried foods and eat more fruits and vegetables 4.  Maintain adequate blood pressure, blood sugar, and blood cholesterol level. Reducing the risk of stroke and cardiovascular disease also helps promoting better memory. 5. Avoid stressful situations. Live a simple life and avoid aggravations. Organize your time and prepare for the next day in anticipation. 6. Sleep well, avoid any interruptions of sleep and avoid any distractions in the bedroom that may interfere with adequate sleep quality 7. Avoid sugar, avoid sweets as there is a strong link between excessive sugar intake, diabetes, and cognitive impairment We discussed the Mediterranean diet, which has been shown to help patients reduce the risk of progressive memory disorders and reduces cardiovascular risk. This includes eating fish, eat fruits and green leafy vegetables, nuts like almonds and hazelnuts, walnuts, and also use olive oil. Avoid fast foods and fried foods as much as possible. Avoid sweets and sugar as sugar use has been linked to worsening of memory function.  There is always a concern of gradual progression of memory problems. If this is the case, then we may need to adjust level of care according to patient needs. Support, both to the patient and caregiver, should then be put into place.      Mediterranean Diet  Why follow it? Research shows. Those who follow the Mediterranean diet have a reduced risk of heart disease  The diet is associated with a reduced incidence of Parkinson's and Alzheimer's diseases People following the diet may have longer life expectancies and lower rates of chronic diseases  The Dietary Guidelines for Americans recommends the Mediterranean diet as an eating plan to promote health   and prevent disease  What Is the Mediterranean Diet?  Healthy eating plan based on typical foods and recipes of Mediterranean-style cooking The diet is primarily a plant based diet; these foods should make up a majority of meals   Starches - Plant based foods should make up a  majority of meals - They are an important sources of vitamins, minerals, energy, antioxidants, and fiber - Choose whole grains, foods high in fiber and minimally processed items  - Typical grain sources include wheat, oats, barley, corn, brown rice, bulgar, farro, millet, polenta, couscous  - Various types of beans include chickpeas, lentils, fava beans, black beans, white beans   Fruits  Veggies - Large quantities of antioxidant rich fruits & veggies; 6 or more servings  - Vegetables can be eaten raw or lightly drizzled with oil and cooked  - Vegetables common to the traditional Mediterranean Diet include: artichokes, arugula, beets, broccoli, brussel sprouts, cabbage, carrots, celery, collard greens, cucumbers, eggplant, kale, leeks, lemons, lettuce, mushrooms, okra, onions, peas, peppers, potatoes, pumpkin, radishes, rutabaga, shallots, spinach, sweet potatoes, turnips, zucchini - Fruits common to the Mediterranean Diet include: apples, apricots, avocados, cherries, clementines, dates, figs, grapefruits, grapes, melons, nectarines, oranges, peaches, pears, pomegranates, strawberries, tangerines  Fats - Replace butter and margarine with healthy oils, such as olive oil, canola oil, and tahini  - Limit nuts to no more than a handful a day  - Nuts include walnuts, almonds, pecans, pistachios, pine nuts  - Limit or avoid candied, honey roasted or heavily salted nuts - Olives are central to the Mediterranean diet - can be eaten whole or used in a variety of dishes   Meats Protein - Limiting red meat: no more than a few times a month - When eating red meat: choose lean cuts and keep the portion to the size of deck of cards - Eggs: approx. 0 to 4 times a week  - Fish and lean poultry: at least 2 a week  - Healthy protein sources include, chicken, turkey, lean beef, lamb - Increase intake of seafood such as tuna, salmon, trout, mackerel, shrimp, scallops - Avoid or limit high fat processed meats such  as sausage and bacon  Dairy - Include moderate amounts of low fat dairy products  - Focus on healthy dairy such as fat free yogurt, skim milk, low or reduced fat cheese - Limit dairy products higher in fat such as whole or 2% milk, cheese, ice cream  Alcohol - Moderate amounts of red wine is ok  - No more than 5 oz daily for women (all ages) and men older than age 65  - No more than 10 oz of wine daily for men younger than 65  Other - Limit sweets and other desserts  - Use herbs and spices instead of salt to flavor foods  - Herbs and spices common to the traditional Mediterranean Diet include: basil, bay leaves, chives, cloves, cumin, fennel, garlic, lavender, marjoram, mint, oregano, parsley, pepper, rosemary, sage, savory, sumac, tarragon, thyme   It's not just a diet, it's a lifestyle:  The Mediterranean diet includes lifestyle factors typical of those in the region  Foods, drinks and meals are best eaten with others and savored Daily physical activity is important for overall good health This could be strenuous exercise like running and aerobics This could also be more leisurely activities such as walking, housework, yard-work, or taking the stairs Moderation is the key; a balanced and healthy diet accommodates most foods and drinks Consider portion   sizes and frequency of consumption of certain foods   Meal Ideas & Options:  Breakfast:  Whole wheat toast or whole wheat English muffins with peanut butter & hard boiled egg Steel cut oats topped with apples & cinnamon and skim milk  Fresh fruit: banana, strawberries, melon, berries, peaches  Smoothies: strawberries, bananas, greek yogurt, peanut butter Low fat greek yogurt with blueberries and granola  Egg white omelet with spinach and mushrooms Breakfast couscous: whole wheat couscous, apricots, skim milk, cranberries  Sandwiches:  Hummus and grilled vegetables (peppers, zucchini, squash) on whole wheat bread   Grilled chicken on  whole wheat pita with lettuce, tomatoes, cucumbers or tzatziki  Tuna salad on whole wheat bread: tuna salad made with greek yogurt, olives, red peppers, capers, green onions Garlic rosemary lamb pita: lamb sauted with garlic, rosemary, salt & pepper; add lettuce, cucumber, greek yogurt to pita - flavor with lemon juice and black pepper  Seafood:  Mediterranean grilled salmon, seasoned with garlic, basil, parsley, lemon juice and black pepper Shrimp, lemon, and spinach whole-grain pasta salad made with low fat greek yogurt  Seared scallops with lemon orzo  Seared tuna steaks seasoned salt, pepper, coriander topped with tomato mixture of olives, tomatoes, olive oil, minced garlic, parsley, green onions and cappers  Meats:  Herbed greek chicken salad with kalamata olives, cucumber, feta  Red bell peppers stuffed with spinach, bulgur, lean ground beef (or lentils) & topped with feta   Kebabs: skewers of chicken, tomatoes, onions, zucchini, squash  Turkey burgers: made with red onions, mint, dill, lemon juice, feta cheese topped with roasted red peppers Vegetarian Cucumber salad: cucumbers, artichoke hearts, celery, red onion, feta cheese, tossed in olive oil & lemon juice  Hummus and whole grain pita points with a greek salad (lettuce, tomato, feta, olives, cucumbers, red onion) Lentil soup with celery, carrots made with vegetable broth, garlic, salt and pepper  Tabouli salad: parsley, bulgur, mint, scallions, cucumbers, tomato, radishes, lemon juice, olive oil, salt and pepper. 

## 2021-09-14 NOTE — Progress Notes (Signed)
NEUROLOGY FOLLOW UP OFFICE NOTE  Chris [name] [id number] [birth year]  HISTORY OF PRESENT ILLNESS: I had the pleasure of seeing Chris [name] in follow-up in the neurology clinic on 09/14/2021.  The patient was last seen 6 months ago by Memory Disorder PA Sharene Butters. He is again accompanied by his wife who helps supplement the history today.  Records and images were personally reviewed where available.  Since his last visit, he states his memory is "not good at all." He is on Donepezil '10mg'$  daily and Memantine '10mg'$  BID without side effects. His wife manages finances, medication, meals. He does not drive. He states "my mind does not work," he just does not feel good when he wakes up. He sits around the room and "that's all there is." There is apathy, when asked about mood he states "I don't even have one, none at all." He has a hard time finding things that interest him. He does see Psychiatry. He walks daily and his wife feels this seems to help make him feel a little better. No hallucinations or paranoia. He continues to have daily headaches, no side effects on gabapentin '100mg'$  qhs. They report that after taking his night dose, he feels much better. He takes Tylenol and Ibuprofen once a day, the Ibuprofen is for body aches and pains. He denies any dizziness, vision changes, focal numbness/tingling/weakness, no falls.    History on Initial Assessment 05/30/2018: This is a 76 year old right-handed man with a history of diet-controlled hypertension, depression, anxiety, presenting for evaluation of worsening memory. He states his memory is good and bad at certain things, she cannot recall what he did yesterday, he would not recall the date and have to look at his calendar. He denies getting lost driving. His wife occasionally reminds him to take his medications. He has frequent headaches and wants to take over the counter medication all day long. His wife manages bills. His wife states he has never  been one to remember names, but in the past 6 months, she has noticed he would have difficulty thinking of the name of an object and get dates and times confused. He would ask her 5 times in one day what time his doctor appointment was. She denies any significant driving concerns. She has noticed that he gets really nervous and shaky around 2pm, then calms down by 8pm. Sleep is good, no wandering behavior. He still feels drowsy during the day. No paranoia or hallucinations. They report he had failed several grades in school, joined the [employer], then went to [employer] where he had a GPA of 3.85. It was in college where he was formally diagnosed with dyslexia. 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 There is no family history of dementia. No history of significant head injuries. He drinks 1 big can of beer a week, sometimes he drinks 3-4 times a week.    He has had daily headaches for several years and saw neurologist Dr. Domingo Cocking in 2018. He had trigger point injections and became headache-free for a while, until he had a fall with loss of consciousness last September 2019 and headaches recurred. He has a moderate ache in the frontal region constantly, no nausea/vomiting, photo/phonophobia. He has been taking Tylenol and BC powders but states he does not take it quite everyday. He has dizziness with lightheadedness sometimes lasting all day until he takes meclizine which helps. He denies any diplopia, dysarthria/dysphagia, back pain, bladder dysfunction, anosmia. He has  occasional neck pain and constipation. He started noticing bilateral hand tremors 2-3 years ago which do not affect writing or using utensils. He fell while walking on a trail last September, he recalls starting feeling bad in his stomach and started walking faster, then passed out. He feels he was only out for a second, he does not remember hitting the ground but stood up and and realized he had fallen down. He had  significant injury on the left eyelid and left side of his face and underwent plastic surgery. He still has stitches on the left lateral eyelid. He has occasional numbness and tingling in his right hand, it feels cold sometimes. He has noticed difficulty buttoning or tying shoelaces with his right hand. His wife has noticed shuffling when walking over the past year.   I personally reviewed MRI brain without contrast done 11/27/2016 which did not show any acute changes. There was scattered foci of susceptibility are present over the posterior left temporal lobe and occipital lobe, increased dural vasculature is suggested on axial image 8 of series 7. 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 There was moderate diffuse atrophy and mild chronic microvascular disease  I personally reviewed MRI brain with and without contrast done February 2020 which did not show any acute changes.There was stable small chronic infarct within the right lateral temporal lobe, stable mild chronic microvascular disease, moderate diffuse volume loss with prominent volume loss in the anteromedial temporal lobes and parahippocampal gyri (which can be seen with Alzheimer's or frontotemporal lobar degeneration). There were numerous foci of chronic microhemorrhages predominantly in the posterior distribution, greater on the left (favoring amyloid angiopathy).   Neuropsychological testing in 02/2019 showed Major Neurocognitive Disorder (dementia), mild end of spectrum. Etiology likely multifactorial, vascular and Alzheimer's disease.   TSH and B12 unremarkable.     PAST MEDICAL HISTORY: Past Medical History:  Diagnosis Date   Anxiety    Chest pain, unspecified    Depression    Dyslexia    Hearing loss    Attributed to recurring sinus infections   Seasonal allergies    Stroke [facility])    Right lateral temporal lobe   Unspecified essential hypertension     MEDICATIONS: Current Outpatient Medications on File Prior  to Visit  Medication Sig Dispense Refill   acetaminophen (TYLENOL) 325 MG tablet Take 650 mg by mouth every 6 (six) hours as needed.     Aspirin-Salicylamide-Caffeine (BC HEADACHE PO) Take by mouth.     buPROPion (WELLBUTRIN XL) 150 MG 24 hr tablet      busPIRone (BUSPAR) 15 MG tablet Take 15 mg by mouth 3 (three) times daily.      desvenlafaxine (PRISTIQ) 100 MG 24 hr tablet      donepezil (ARICEPT) 10 MG tablet Take 1 tablet daily 90 tablet 3   fluticasone (FLONASE) 50 MCG/ACT nasal spray      gabapentin (NEURONTIN) 100 MG capsule Take 1 capsule every night 30 capsule 11   ibuprofen (ADVIL) 200 MG tablet Take 200 mg by mouth every 6 (six) hours as needed.     losartan (COZAAR) 25 MG tablet Take 25 mg by mouth daily.     Meclizine HCl 25 MG CHEW Chew by mouth.     Melatonin 10 MG CAPS Take 20 mg by mouth at bedtime.     memantine (NAMENDA) 10 MG tablet Take 1 tablet twice a day 60 tablet 11   montelukast (SINGULAIR) 10 MG tablet  Multiple Vitamins-Minerals (MULTIVITAL) tablet Take 1 tablet by mouth daily.     Phenylephrine-DM-GG-APAP (MUCINEX SINUS-MAX) 5-10-200-325 MG TABS Take by mouth.     silodosin (RAPAFLO) 8 MG CAPS capsule Take 1 capsule (8 mg total) by mouth daily with breakfast. 90 capsule 3   vitamin B-12 (CYANOCOBALAMIN) 100 MCG tablet Take 100 mcg by mouth daily.     No current facility-administered medications on file prior to visit.    ALLERGIES: No Known Allergies  FAMILY HISTORY: Family History  Problem Relation Age of Onset   Heart disease Mother    Cancer Father     SOCIAL HISTORY: Social History   Socioeconomic History   Marital status: Married    Spouse name: Not on file   Number of children: 2   Years of education: 16   Highest education level: Bachelor's degree (e.g., BA, AB, BS)  Occupational History   Not on file  Tobacco Use   Smoking status: Former    Packs/day: 0.50    Years: 2.00    Pack years: 1.00    Types: Cigarettes    Quit date:  08/03/1969    Years since quitting: 52.1   Smokeless tobacco: Never  Vaping Use   Vaping Use: Never used  Substance and Sexual Activity   Alcohol use: Yes    Comment: rare   Drug use: No   Sexual activity: Yes  Other Topics Concern   Not on file  Social History Narrative   Pt is right handed   Lives in single story home with his wife, Jeannene Patella   Has 2 adult children   Bachelors degree in Pensions consultant   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 No aphasia or dysarthria. Fund of knowledge is appropriate.  Recent and remote memory are impaired.  Attention and concentration are normal.  MMSE 26/30    09/14/2021    8:00 AM 09/10/2020   11:00 AM  MMSE - Mini Mental State Exam  Orientation to time 4 4  Orientation to Place 5 5  Registration 3 3  Attention/ Calculation 5 4  Recall 0 2  Language- name 2 objects 2 2  Language- repeat 1 1  Language- follow 3 step command 3 1  Language- read & follow direction 1 1  Write a sentence 1 1  Copy design 1 1  Total score 26 25   Cranial nerves: Pupils equal, round. Extraocular movements intact with no nystagmus. Visual fields full.  No facial asymmetry.  Motor: Bulk and tone normal, muscle strength 5/5 throughout with no pronator drift.   Finger to nose testing intact.  Gait narrow-based and steady, no ataxia.   IMPRESSION: This is a 76 yo RH man with a history of diet-controlled hypertension,  depression, anxiety, and mild dementia. Neuropsychological testing in 02/2019 indicated Major Neurocognitive Disorder (ie dementia), mild, likely multifactorial, vascular and Alzheimer's disease. MRI brain no acute changes, there was  note of temporal lobe atrophy as well as chronic microhemorrhages. MMSE today 26/30 (25/30 in 08/2020). Continue Donepezil '10mg'$  daily and Memantine '10mg'$  BID. He has apathy likely related to underlying dementia, continue follow-up with Psychiatry. He also continues to report daily headaches with apparent response to gabapentin qhs, increase to '100mg'$  BID. May uptitrate as tolerated. We again discussed the importance of control of vascular risk factors, physical and brain stimulation exercises, MIND diet for overall brain health. Follow-up in 6 months, call for any changes.    Thank you for allowing me to participate in his care.  Please do not hesitate to call for any questions or concerns.    Ellouise Newer, M.D.   CC: Lanelle Bal, PA-C

## 2021-09-19 ENCOUNTER — Encounter: Payer: Self-pay | Admitting: Neurology

## 2021-12-27 DIAGNOSIS — F411 Generalized anxiety disorder: Secondary | ICD-10-CM | POA: Diagnosis not present

## 2021-12-27 DIAGNOSIS — I10 Essential (primary) hypertension: Secondary | ICD-10-CM | POA: Diagnosis not present

## 2021-12-27 DIAGNOSIS — Z6828 Body mass index (BMI) 28.0-28.9, adult: Secondary | ICD-10-CM | POA: Diagnosis not present

## 2021-12-27 DIAGNOSIS — M25551 Pain in right hip: Secondary | ICD-10-CM | POA: Diagnosis not present

## 2021-12-27 DIAGNOSIS — F0390 Unspecified dementia without behavioral disturbance: Secondary | ICD-10-CM | POA: Diagnosis not present

## 2021-12-27 DIAGNOSIS — J309 Allergic rhinitis, unspecified: Secondary | ICD-10-CM | POA: Diagnosis not present

## 2021-12-27 DIAGNOSIS — R972 Elevated prostate specific antigen [PSA]: Secondary | ICD-10-CM | POA: Diagnosis not present

## 2021-12-27 DIAGNOSIS — Z0000 Encounter for general adult medical examination without abnormal findings: Secondary | ICD-10-CM | POA: Diagnosis not present

## 2022-01-06 ENCOUNTER — Other Ambulatory Visit: Payer: Medicare PPO

## 2022-01-06 DIAGNOSIS — R972 Elevated prostate specific antigen [PSA]: Secondary | ICD-10-CM

## 2022-01-07 LAB — PSA: Prostate Specific Ag, Serum: 7.7 ng/mL — ABNORMAL HIGH (ref 0.0–4.0)

## 2022-01-13 ENCOUNTER — Encounter: Payer: Self-pay | Admitting: Urology

## 2022-01-13 ENCOUNTER — Ambulatory Visit: Payer: Medicare PPO | Admitting: Urology

## 2022-01-13 VITALS — BP 127/87 | HR 85

## 2022-01-13 DIAGNOSIS — R972 Elevated prostate specific antigen [PSA]: Secondary | ICD-10-CM | POA: Diagnosis not present

## 2022-01-13 DIAGNOSIS — R351 Nocturia: Secondary | ICD-10-CM

## 2022-01-13 DIAGNOSIS — N138 Other obstructive and reflux uropathy: Secondary | ICD-10-CM

## 2022-01-13 DIAGNOSIS — N401 Enlarged prostate with lower urinary tract symptoms: Secondary | ICD-10-CM

## 2022-01-13 MED ORDER — SILODOSIN 8 MG PO CAPS: 8.0000 mg | ORAL_CAPSULE | Freq: Every day | ORAL | 3 refills | Status: DC

## 2022-01-13 NOTE — Progress Notes (Signed)
01/13/2022 9:31 AM   Chris [name]. 1945/11/19 [id number]  Referring provider: Lanelle Bal, PA-C [place],  [place] [address]  Followup elevated PSA and BPH   HPI: Chris [name] is a 76yo here for followup for BPH and elevated PSA. PSA increased to 7.7 from 6.8. IPSS 12 QOL 3 on rapaflo '8mg'$  Nocturia 2-3x. He has medium volumes at night. Urine stream strong. No straining to urinate.    PMH: Past Medical History:  Diagnosis Date   Anxiety    Chest pain, unspecified    Depression    Dyslexia    Hearing loss    Attributed to recurring sinus infections   Seasonal allergies    Stroke ([place])    Right lateral temporal lobe   Unspecified essential hypertension     Surgical History: Past Surgical History:  Procedure Laterality Date   HIP ARTHROPLASTY     TOTAL KNEE ARTHROPLASTY      Home Medications:  Allergies as of 01/13/2022   No Known Allergies      Medication List        Accurate as of January 13, 2022  9:31 AM. If you have any questions, ask your nurse or doctor.          acetaminophen 325 MG tablet Commonly known as: TYLENOL Take 650 mg by mouth every 6 (six) hours as needed.   BC HEADACHE PO Take by mouth.   buPROPion 150 MG 24 hr tablet Commonly known as: WELLBUTRIN XL   busPIRone 15 MG tablet Commonly known as: BUSPAR Take 15 mg by mouth 3 (three) times daily.   desvenlafaxine 100 MG 24 hr tablet Commonly known as: PRISTIQ   donepezil 10 MG tablet Commonly known as: ARICEPT Take 1 tablet daily   Fish Oil 1000 MG Caps Take by mouth.   fluticasone 50 MCG/ACT nasal spray Commonly known as: FLONASE   gabapentin 100 MG capsule Commonly known as: Neurontin Take 1 capsule twice a day   ibuprofen 200 MG tablet Commonly known as: ADVIL Take 200 mg by mouth every 6 (six) hours as needed.   losartan 25 MG tablet Commonly known as: COZAAR Take 25 mg by mouth daily.   Meclizine HCl 25 MG Chew Chew by mouth.   Melatonin 10 MG  Caps Take 20 mg by mouth at bedtime.   memantine 10 MG tablet Commonly known as: Namenda Take 1 tablet twice a day   montelukast 10 MG tablet Commonly known as: SINGULAIR   Mucinex Sinus-Max 5-10-200-325 MG Tabs Generic drug: Phenylephrine-DM-GG-APAP Take by mouth.   Multivital tablet Take 1 tablet by mouth daily.   silodosin 8 MG Caps capsule Commonly known as: RAPAFLO Take 1 capsule (8 mg total) by mouth daily with breakfast.   vitamin B-12 100 MCG tablet Commonly known as: CYANOCOBALAMIN Take 100 mcg by mouth daily.        Allergies: No Known Allergies  Family History: Family History  Problem Relation Age of Onset   Heart disease Mother    Cancer Father     Social History:  reports that he quit smoking about 52 years ago. He has a 1.00 pack-year smoking history. He has never used smokeless tobacco. He reports current alcohol use. He reports that he does not use drugs.  ROS: All other review of systems were reviewed and are negative except what is noted above in HPI  Physical Exam: BP 127/87   Pulse 85   Constitutional:  Alert and oriented,  No acute distress. HEENT: [place] AT, moist mucus membranes.  Trachea midline, no masses. Cardiovascular: No clubbing, cyanosis, or edema. Respiratory: Normal respiratory effort, no increased work of breathing. GI: Abdomen is soft, nontender, nondistended, no abdominal masses GU: No CVA tenderness.  Lymph: No cervical or inguinal lymphadenopathy. Skin: No rashes, bruises or suspicious lesions. Neurologic: Grossly intact, no focal deficits, moving all 4 extremities. Psychiatric: Normal mood and affect.  Laboratory Data: Lab Results  Component Value Date   WBC 5.7 01/18/2008   HGB 13.8 01/18/2008   HCT 39.8 01/18/2008   MCV 92.6 01/18/2008   PLT 159 01/18/2008    Lab Results  Component Value Date   CREATININE 0.88 01/18/2008    No results found for: "PSA"  No results found for: "TESTOSTERONE"  No results found  for: "HGBA1C"  Urinalysis    Component Value Date/Time   APPEARANCEUR Clear 07/11/2021 1111   GLUCOSEU Negative 07/11/2021 1111   BILIRUBINUR Negative 07/11/2021 1111   PROTEINUR Negative 07/11/2021 1111   UROBILINOGEN 0.2 08/04/2019 1340   NITRITE Negative 07/11/2021 1111   LEUKOCYTESUR Negative 07/11/2021 1111    Lab Results  Component Value Date   LABMICR Comment 07/11/2021   WBCUA None seen 07/09/2020   LABEPIT None seen 07/09/2020   MUCUS Present 02/04/2020   BACTERIA None seen 07/09/2020    Pertinent Imaging:  No results found for this or any previous visit.  No results found for this or any previous visit.  No results found for this or any previous visit.  No results found for this or any previous visit.  Results for orders placed during the hospital encounter of 01/04/21  Ultrasound renal complete  Narrative CLINICAL DATA:  Nephrolithiasis.  EXAM: RENAL / URINARY TRACT ULTRASOUND COMPLETE  COMPARISON:  January 22, 2020.  FINDINGS: Right Kidney:  Renal measurements: 9.0 x 5.6 x 4.4 cm = volume: 115 mL. Echogenicity within normal limits. No mass or hydronephrosis visualized.  Left Kidney:  Renal measurements: 11.0 x 5.6 x 4.9 cm = volume: 158 mL. Echogenicity within normal limits. No mass or hydronephrosis visualized.  Bladder:  Not well distended and therefore not well visualized. Patient reportedly voided prior to exam.  Other:  None.  IMPRESSION: No renal abnormality is noted.   Electronically Signed By: Marijo Conception M.D. On: 01/05/2021 09:59  No valid procedures specified. No results found for this or any previous visit.  No results found for this or any previous visit.   Assessment & Plan:    1. Elevated PSA RTC 6 months with PSA  2. Benign prostatic hyperplasia with urinary obstruction -Continue rapaflo '8mg'$   3. Nocturia Continue rapaflo '8mg'$    No follow-ups on file.  Nicolette Bang, MD  [facility]  [place]

## 2022-01-13 NOTE — Patient Instructions (Signed)

## 2022-02-04 IMAGING — US US RENAL
1 series · 14 of 25 positions shown · non-contrast
Comparison: CT abdomen and pelvis 07/27/2019

CLINICAL DATA: Nephrolithiasis

EXAM:
RENAL / URINARY TRACT ULTRASOUND COMPLETE

[Series 1: us renal · 14 of 49 slices shown]
[im 1/49]
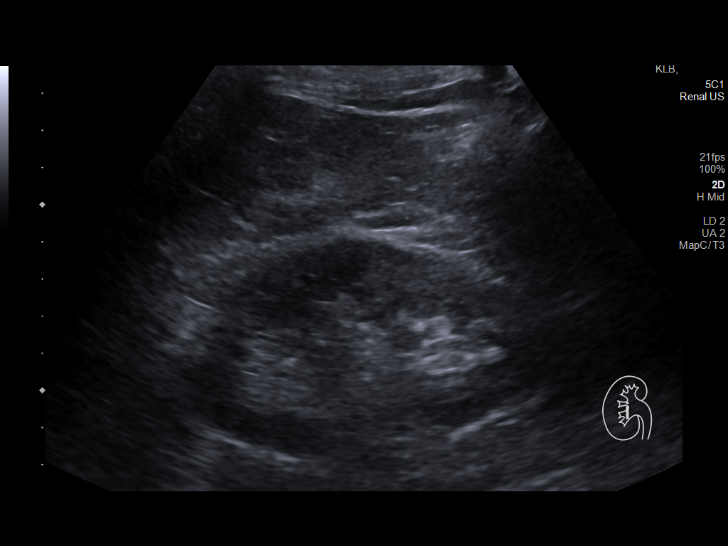
[im 5/49]
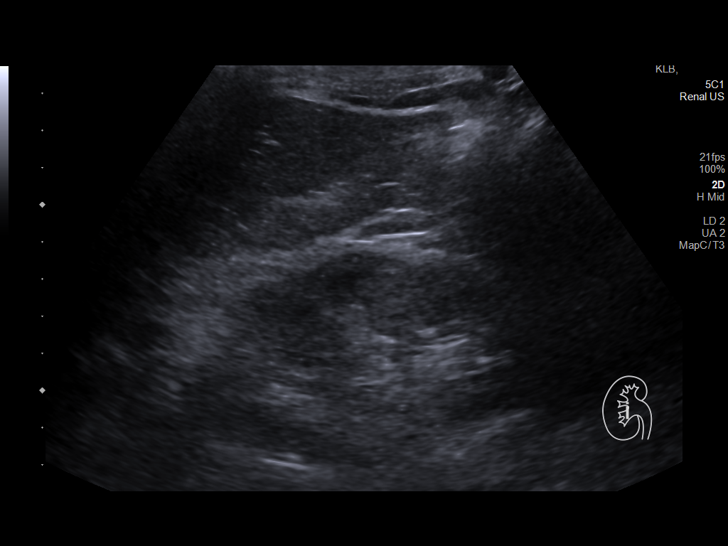
[im 9/49]
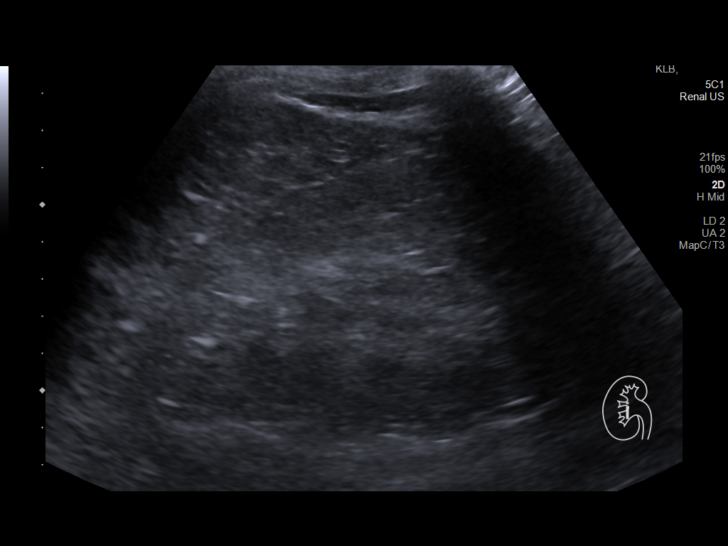
[im 13/49]
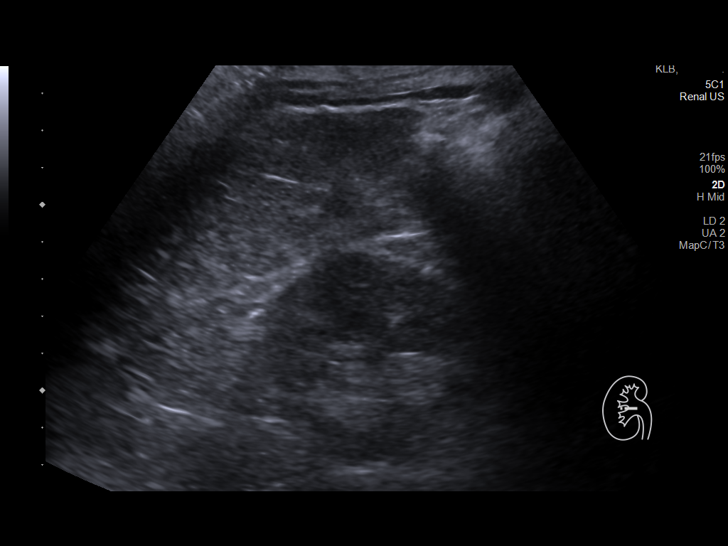
[im 17/49]
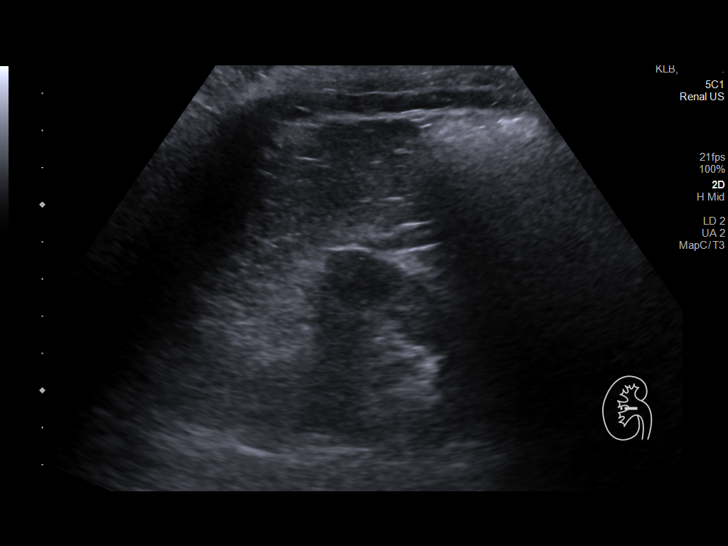
[im 19/49]
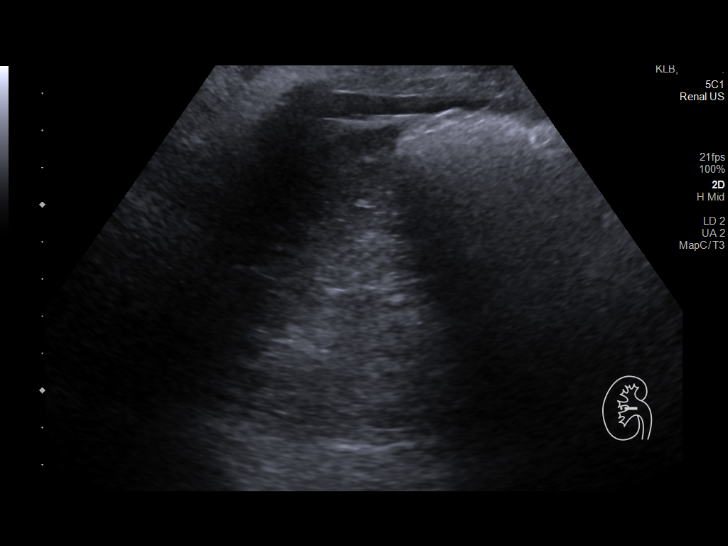
[im 23/49]
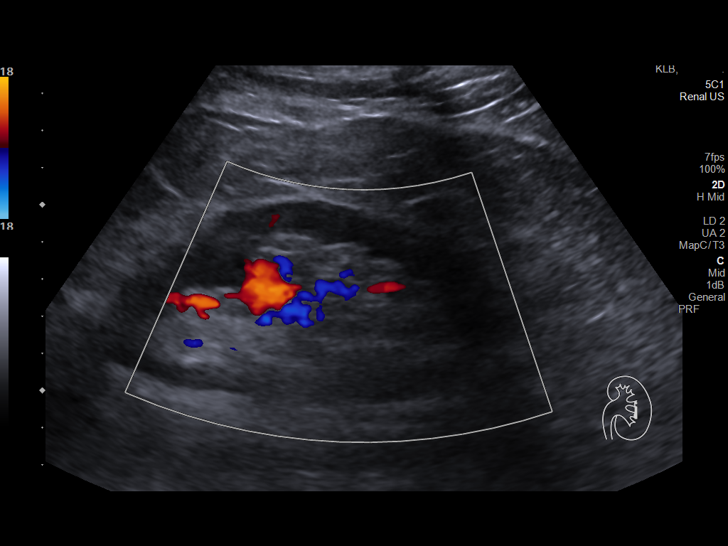
[im 27/49]
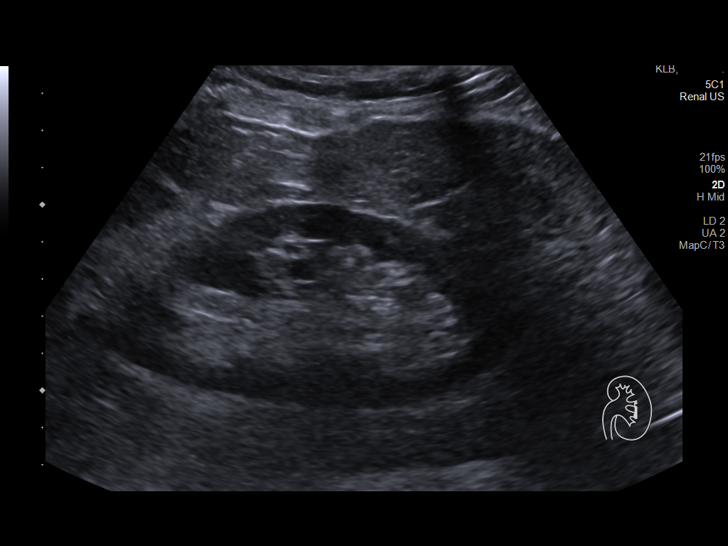
[im 31/49]
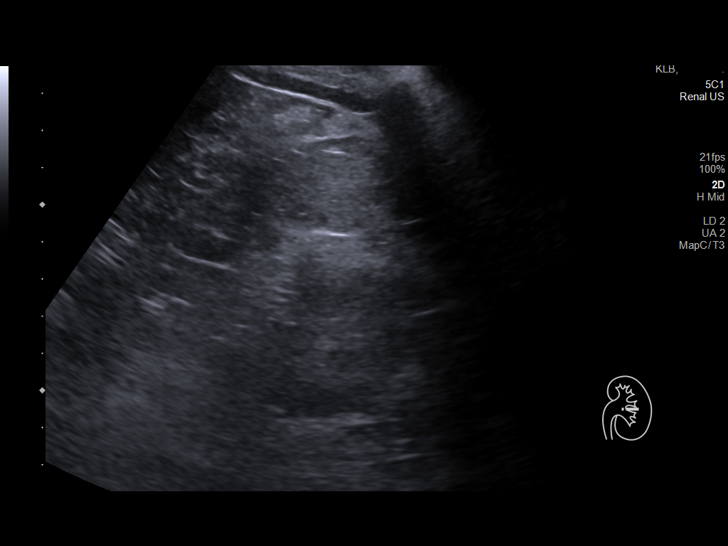
[im 33/49]
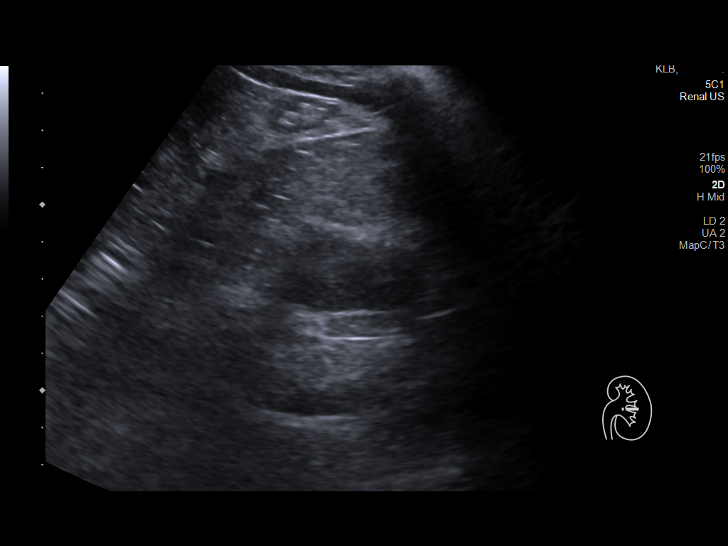
[im 37/49]
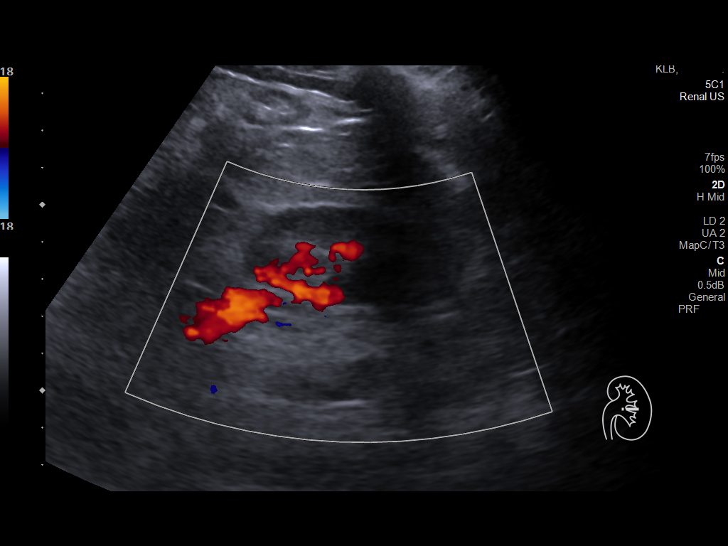
[im 41/49]
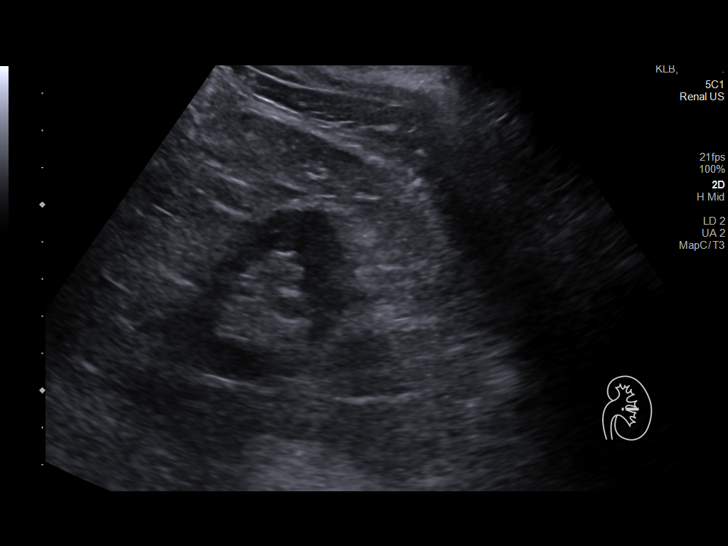
[im 45/49]
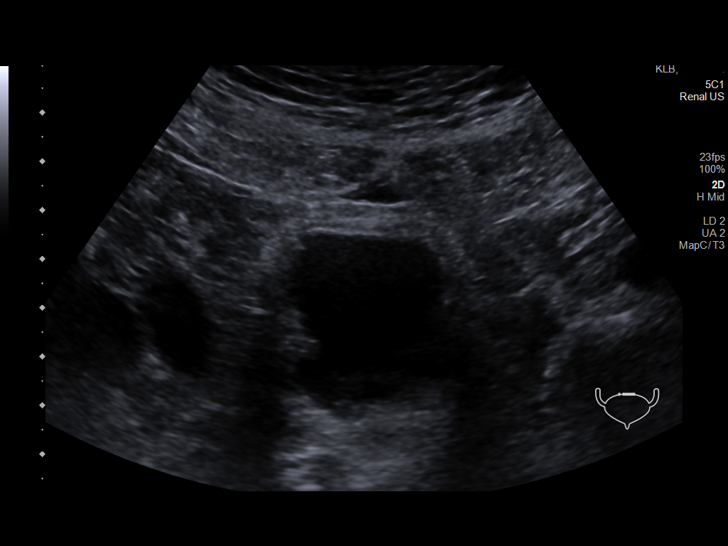
[im 49/49]
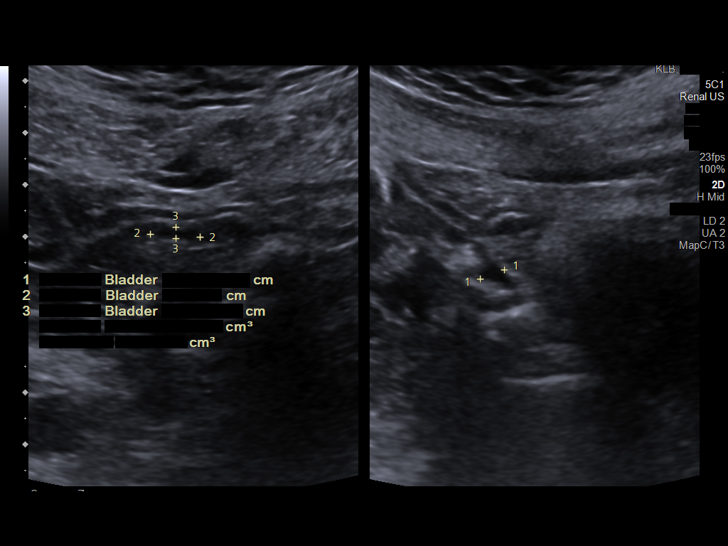

[14 of 25 positions shown; findings below may reference images not displayed]

FINDINGS: Right Kidney:

Renal measurements: 9.9 x 5.5 x 5.1 cm = volume: 145 mL. Normal
cortical thickness. Upper normal cortical echogenicity. No mass,
hydronephrosis or shadowing calcification.

Left Kidney:

Renal measurements: 10.8 x 5.5 x 4.4 cm = volume: 137 mL. Cortical
thinning. Normal cortical echogenicity. No mass, hydronephrosis, or
shadowing calcification.

Bladder:

Appears normal for degree of bladder distention. RIGHT ureteral jet
was visualized.

Other:

No postvoid residual within urinary bladder.
IMPRESSION: No renal sonographic abnormalities.

## 2022-02-11 DIAGNOSIS — R030 Elevated blood-pressure reading, without diagnosis of hypertension: Secondary | ICD-10-CM | POA: Diagnosis not present

## 2022-02-11 DIAGNOSIS — Z6829 Body mass index (BMI) 29.0-29.9, adult: Secondary | ICD-10-CM | POA: Diagnosis not present

## 2022-02-11 DIAGNOSIS — R682 Dry mouth, unspecified: Secondary | ICD-10-CM | POA: Diagnosis not present

## 2022-02-11 DIAGNOSIS — J309 Allergic rhinitis, unspecified: Secondary | ICD-10-CM | POA: Diagnosis not present

## 2022-02-21 DIAGNOSIS — F419 Anxiety disorder, unspecified: Secondary | ICD-10-CM | POA: Diagnosis not present

## 2022-02-21 DIAGNOSIS — G3184 Mild cognitive impairment, so stated: Secondary | ICD-10-CM | POA: Diagnosis not present

## 2022-02-21 DIAGNOSIS — F332 Major depressive disorder, recurrent severe without psychotic features: Secondary | ICD-10-CM | POA: Diagnosis not present

## 2022-03-02 DIAGNOSIS — Z6828 Body mass index (BMI) 28.0-28.9, adult: Secondary | ICD-10-CM | POA: Diagnosis not present

## 2022-03-02 DIAGNOSIS — K1379 Other lesions of oral mucosa: Secondary | ICD-10-CM | POA: Diagnosis not present

## 2022-03-02 DIAGNOSIS — R030 Elevated blood-pressure reading, without diagnosis of hypertension: Secondary | ICD-10-CM | POA: Diagnosis not present

## 2022-03-23 ENCOUNTER — Ambulatory Visit: Payer: Medicare PPO | Admitting: Physician Assistant

## 2022-03-23 DIAGNOSIS — R519 Headache, unspecified: Secondary | ICD-10-CM | POA: Diagnosis not present

## 2022-03-23 DIAGNOSIS — F0390 Unspecified dementia without behavioral disturbance: Secondary | ICD-10-CM | POA: Diagnosis not present

## 2022-03-23 MED ORDER — DONEPEZIL HCL 10 MG PO TABS: ORAL_TABLET | ORAL | 3 refills | Status: DC

## 2022-03-23 MED ORDER — GABAPENTIN 100 MG PO CAPS: ORAL_CAPSULE | ORAL | 3 refills | Status: DC

## 2022-03-23 MED ORDER — MEMANTINE HCL 10 MG PO TABS: ORAL_TABLET | ORAL | 3 refills | Status: DC

## 2022-03-23 NOTE — Patient Instructions (Signed)
Good to see you. Hopefully we can get you feeling a little better  Increase Gabapentin '100mg'$ : Take 1 capsule twice a day  2. Continue Donepezil '10mg'$  daily and Memantine '10mg'$  twice a day  3. Increase physical activity   4. Discuss mood with psychiatrist  5. Follow-up in 6 months, call for any changes   FALL PRECAUTIONS: Be cautious when walking. Scan the area for obstacles that may increase the risk of trips and falls. When getting up in the mornings, sit up at the edge of the bed for a few minutes before getting out of bed. Consider elevating the bed at the head end to avoid drop of blood pressure when getting up. Walk always in a well-lit room (use night lights in the walls). Avoid area rugs or power cords from appliances in the middle of the walkways. Use a walker or a cane if necessary and consider physical therapy for balance exercise. Get your eyesight checked regularly.  HOME SAFETY: Consider the safety of the kitchen when operating appliances like stoves, microwave oven, and blender. Consider having supervision and share cooking responsibilities until no longer able to participate in those. Accidents with firearms and other hazards in the house should be identified and addressed as well.  ABILITY TO BE LEFT ALONE: If patient is unable to contact 911 operator, consider using LifeLine, or when the need is there, arrange for someone to stay with patients. Smoking is a fire hazard, consider supervision or cessation. Risk of wandering should be assessed by caregiver and if detected at any point, supervision and safe proof recommendations should be instituted.   RECOMMENDATIONS FOR ALL PATIENTS WITH MEMORY PROBLEMS: 1. Continue to exercise (Recommend 30 minutes of walking everyday, or 3 hours every week) 2. Increase social interactions - continue going to [place] and enjoy social gatherings with friends and family 3. Eat healthy, avoid fried foods and eat more fruits and vegetables 4.  Maintain adequate blood pressure, blood sugar, and blood cholesterol level. Reducing the risk of stroke and cardiovascular disease also helps promoting better memory. 5. Avoid stressful situations. Live a simple life and avoid aggravations. Organize your time and prepare for the next day in anticipation. 6. Sleep well, avoid any interruptions of sleep and avoid any distractions in the bedroom that may interfere with adequate sleep quality 7. Avoid sugar, avoid sweets as there is a strong link between excessive sugar intake, diabetes, and cognitive impairment We discussed the Mediterranean diet, which has been shown to help patients reduce the risk of progressive memory disorders and reduces cardiovascular risk. This includes eating fish, eat fruits and green leafy vegetables, nuts like almonds and hazelnuts, walnuts, and also use olive oil. Avoid fast foods and fried foods as much as possible. Avoid sweets and sugar as sugar use has been linked to worsening of memory function.  There is always a concern of gradual progression of memory problems. If this is the case, then we may need to adjust level of care according to patient needs. Support, both to the patient and caregiver, should then be put into place.      Mediterranean Diet  Why follow it? Research shows. Those who follow the Mediterranean diet have a reduced risk of heart disease  The diet is associated with a reduced incidence of Parkinson's and Alzheimer's diseases People following the diet may have longer life expectancies and lower rates of chronic diseases  The Dietary Guidelines for Americans recommends the Mediterranean diet as an eating plan to promote health  and prevent disease  What Is the Mediterranean Diet?  Healthy eating plan based on typical foods and recipes of Mediterranean-style cooking The diet is primarily a plant based diet; these foods should make up a majority of meals   Starches - Plant based foods should make up a  majority of meals - They are an important sources of vitamins, minerals, energy, antioxidants, and fiber - Choose whole grains, foods high in fiber and minimally processed items  - Typical grain sources include wheat, oats, barley, corn, brown rice, bulgar, farro, millet, polenta, couscous  - Various types of beans include chickpeas, lentils, fava beans, black beans, white beans   Fruits  Veggies - Large quantities of antioxidant rich fruits & veggies; 6 or more servings  - Vegetables can be eaten raw or lightly drizzled with oil and cooked  - Vegetables common to the traditional Mediterranean Diet include: artichokes, arugula, beets, broccoli, brussel sprouts, cabbage, carrots, celery, collard greens, cucumbers, eggplant, kale, leeks, lemons, lettuce, mushrooms, okra, onions, peas, peppers, potatoes, pumpkin, radishes, rutabaga, shallots, spinach, sweet potatoes, turnips, zucchini - Fruits common to the Mediterranean Diet include: apples, apricots, avocados, cherries, clementines, dates, figs, grapefruits, grapes, melons, nectarines, oranges, peaches, pears, pomegranates, strawberries, tangerines  Fats - Replace butter and margarine with healthy oils, such as olive oil, canola oil, and tahini  - Limit nuts to no more than a handful a day  - Nuts include walnuts, almonds, pecans, pistachios, pine nuts  - Limit or avoid candied, honey roasted or heavily salted nuts - Olives are central to the [employer] - can be eaten whole or used in a variety of dishes   Meats Protein - Limiting red meat: no more than a few times a month - When eating red meat: choose lean cuts and keep the portion to the size of deck of cards - Eggs: approx. 0 to 4 times a week  - Fish and lean poultry: at least 2 a week  - Healthy protein sources include, chicken, [place], lean beef, lamb - Increase intake of seafood such as tuna, salmon, trout, mackerel, shrimp, scallops - Avoid or limit high fat processed meats such  as sausage and bacon  Dairy - Include moderate amounts of low fat dairy products  - Focus on healthy dairy such as fat free yogurt, skim milk, low or reduced fat cheese - Limit dairy products higher in fat such as whole or 2% milk, cheese, ice cream  Alcohol - Moderate amounts of red wine is ok  - No more than 5 oz daily for women (all ages) and men older than age 1  - No more than 10 oz of wine daily for men younger than 2  Other - Limit sweets and other desserts  - Use herbs and spices instead of salt to flavor foods  - Herbs and spices common to the traditional Mediterranean Diet include: basil, bay leaves, chives, cloves, cumin, fennel, garlic, lavender, marjoram, mint, oregano, parsley, pepper, rosemary, sage, savory, sumac, tarragon, thyme   It's not just a diet, it's a lifestyle:  The Mediterranean diet includes lifestyle factors typical of those in the region  Foods, drinks and meals are best eaten with others and savored Daily physical activity is important for overall good health This could be strenuous exercise like running and aerobics This could also be more leisurely activities such as walking, housework, yard-work, or taking the stairs Moderation is the key; a balanced and healthy diet accommodates most foods and drinks Consider portion  sizes and frequency of consumption of certain foods   Meal Ideas & Options:  Breakfast:  Whole wheat toast or whole wheat English muffins with peanut butter & hard boiled egg Steel cut oats topped with apples & cinnamon and skim milk  Fresh fruit: banana, strawberries, melon, berries, peaches  Smoothies: strawberries, bananas, greek yogurt, peanut butter Low fat greek yogurt with blueberries and granola  Egg white omelet with spinach and mushrooms Breakfast couscous: whole wheat couscous, apricots, skim milk, cranberries  Sandwiches:  Hummus and grilled vegetables (peppers, zucchini, squash) on whole wheat bread   Grilled chicken on  whole wheat pita with lettuce, tomatoes, cucumbers or tzatziki  [place] salad on whole wheat bread: tuna salad made with greek yogurt, olives, red peppers, capers, green onions Garlic rosemary lamb pita: lamb sauted with garlic, rosemary, salt & pepper; add lettuce, cucumber, greek yogurt to pita - flavor with lemon juice and black pepper  Seafood:  Mediterranean grilled salmon, seasoned with garlic, basil, parsley, lemon juice and black pepper Shrimp, lemon, and spinach whole-grain pasta salad made with low fat greek yogurt  Seared scallops with lemon orzo  Seared tuna steaks seasoned salt, pepper, coriander topped with tomato mixture of olives, tomatoes, olive oil, minced garlic, parsley, green onions and cappers  Meats:  Herbed greek chicken salad with kalamata olives, cucumber, feta  Red bell peppers stuffed with spinach, bulgur, lean ground beef (or lentils) & topped with feta   Kebabs: skewers of chicken, tomatoes, onions, zucchini, squash  [place] burgers: made with red onions, mint, dill, lemon juice, feta cheese topped with roasted red peppers Vegetarian Cucumber salad: cucumbers, artichoke hearts, celery, red onion, feta cheese, tossed in olive oil & lemon juice  Hummus and whole grain pita points with a greek salad (lettuce, tomato, feta, olives, cucumbers, red onion) Lentil soup with celery, carrots made with vegetable broth, garlic, salt and pepper  Tabouli salad: parsley, bulgur, mint, scallions, cucumbers, tomato, radishes, lemon juice, olive oil, salt and pepper.

## 2022-03-23 NOTE — Progress Notes (Signed)
Assessment/Plan:   Dementia likely due to vascular and Alzheimer's disease  Chris [name]. is a very pleasant 76 y.o. RH male with a history of hypertension, depression, anxiety, and major neurocognitive disorder, mild, likely multifactorial, likely due to vascular and Alzheimer's disease as per neuropsychological testing in November 2020 seen today in follow up for memory loss. Patient is currently on donepezil 10 mg daily and memantine 10 mg twice daily, tolerating well.  Personally reviewed prior MRI of the brain was remarkable for temporal lobe atrophy as well as chronic small vessel disease.  Today's MMSE is 25/30, essentially stable from prior in May 2023.  Mood is controlled by psychiatry.  Headache is controlled with gabapentin.    Follow up in 6 months. Continue memantine 10 mg twice daily and donepezil 10 mg daily.  Side effects discussed Continue to control mood as per psychiatry Neuropsych testing for clarity of diagnosis and disease trajectory   Continue gabapentin for headaches, he is to 100 mg twice daily, side effects discussed. Continue to control cardiovascular risk factors Continue to control mood as per PCP    Subjective:    This patient is accompanied in the office by his wife who supplements the history.  Previous records as well as any outside records available were reviewed prior to todays visit.  She was last seen on 09/14/2021 at which time her MMSE was 26/30.   Any changes in memory since last visit?  "Pretty good, no changes ""he may be a little worse in recalling names "-wife says.  He is able to recall conversations.  He no longer likes to do any crossword puzzles and word search especially over the last 4 to 5 months, "I let them pile up ".   repeats oneself?  Endorsed, "but not a lot " Disoriented when walking into a room?  Patient denies  Leaving objects in unusual places?  Patient denies   Ambulates  with difficulty?  He walks about 1 mile daily  with his wife Recent falls?  Patient denies   Any head injuries?  Patient denies   History of seizures?   Patient denies   Wandering behavior?  Patient denies   Patient drives?   He drives short distances Any mood changes since last visit?  Sometimes "but not daily, in the evening he gets angry with himself for what he puts his wife through ", recently his psychiatrist changed him with medications, and this is better controlled. Any worsening depression?:  He has a history of apathy and depression followed by psychiatry currently controlled with the medication regimen Hallucinations?  Patient denies   Paranoia?  Patient denies   Patient reports that sleeps well without vivid dreams, REM behavior or sleepwalking   History of sleep apnea?  Patient denies   Any hygiene concerns?  Patient denies   Independent of bathing and dressing?  Endorsed  Does the patient needs help with medications?  Wife in charge  Who is in charge of the finances?  Wife is in charge   Any changes in appetite?  Patient denies   Patient have trouble swallowing? Patient denies   Does the patient cook?  Wife is in charge of the meals Any headaches?  He has a history of headaches, controlled with gabapentin   Double vision? Patient denies   Any focal numbness or tingling?  Patient denies   Chronic back pain Patient denies   Unilateral weakness?  Patient denies   Any tremors?  Minimal left  greater than right essential tremor, without any other parkinsonian findings. Any history of anosmia?  Patient denies   Any incontinence of urine?  Patient denies   Any bowel dysfunction?   Patient denies      Patient lives with:     History on Initial Assessment 05/30/2018: This is a 76 year old right-handed man with a history of diet-controlled hypertension, depression, anxiety, presenting for evaluation of worsening memory. He states his memory is good and bad at certain things, she cannot recall what he did yesterday, he would not  recall the date and have to look at his calendar. He denies getting lost driving. His wife occasionally reminds him to take his medications. He has frequent headaches and wants to take over the counter medication all day long. His wife manages bills. His wife states he has never been one to remember names, but in the past 6 months, she has noticed he would have difficulty thinking of the name of an object and get dates and times confused. He would ask her 5 times in one day what time his doctor appointment was. She denies any significant driving concerns. She has noticed that he gets really nervous and shaky around 2pm, then calms down by 8pm. Sleep is good, no wandering behavior. He still feels drowsy during the day. No paranoia or hallucinations. They report he had failed several grades in school, joined the [employer], then went to [employer] where he had a GPA of 3.85. It was in college where he was formally diagnosed with dyslexia. 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 There is no family history of dementia. No history of significant head injuries. He drinks 1 big can of beer a week, sometimes he drinks 3-4 times a week.    He has had daily headaches for several years and saw neurologist Dr. Domingo Cocking in 2018. He had trigger point injections and became headache-free for a while, until he had a fall with loss of consciousness last September 2019 and headaches recurred. He has a moderate ache in the frontal region constantly, no nausea/vomiting, photo/phonophobia. He has been taking Tylenol and BC powders but states he does not take it quite everyday. He has dizziness with lightheadedness sometimes lasting all day until he takes meclizine which helps. He denies any diplopia, dysarthria/dysphagia, back pain, bladder dysfunction, anosmia. He has occasional neck pain and constipation. He started noticing bilateral hand tremors 2-3 years ago which do not affect writing or using utensils. He  fell while walking on a trail last September, he recalls starting feeling bad in his stomach and started walking faster, then passed out. He feels he was only out for a second, he does not remember hitting the ground but stood up and realized he had fallen down. He had significant injury on the left eyelid and left side of his face and underwent plastic surgery. He still has stitches on the left lateral eyelid. He has occasional numbness and tingling in his right hand, it feels cold sometimes. He has noticed difficulty buttoning or tying shoelaces with his right hand. His wife has noticed shuffling when walking over the past year.   I personally reviewed MRI brain without contrast done 11/27/2016 which did not show any acute changes. There was scattered foci of susceptibility are present over the posterior left temporal lobe and occipital lobe, increased dural vasculature is suggested on axial image 8 of series 7. 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 There was  moderate diffuse atrophy and mild chronic microvascular disease   I personally reviewed MRI brain with and without contrast done February 2020 which did not show any acute changes.There was stable small chronic infarct within the right lateral temporal lobe, stable mild chronic microvascular disease, moderate diffuse volume loss with prominent volume loss in the anteromedial temporal lobes and parahippocampal gyri (which can be seen with Alzheimer's or frontotemporal lobar degeneration). There were numerous foci of chronic microhemorrhage's predominantly in the posterior distribution, greater on the left (favoring amyloid angiopathy).    Neuropsychological testing in 02/2019 showed Major Neurocognitive Disorder (dementia), mild end of spectrum. Etiology likely multifactorial, vascular and Alzheimer's disease.    TSH and B12 unremarkable.      PREVIOUS MEDICATIONS:   CURRENT MEDICATIONS:  Outpatient Encounter Medications as of  03/23/2022  Medication Sig   acetaminophen (TYLENOL) 325 MG tablet Take 650 mg by mouth every 6 (six) hours as needed.   buPROPion (WELLBUTRIN XL) 150 MG 24 hr tablet    busPIRone (BUSPAR) 15 MG tablet Take 15 mg by mouth 3 (three) times daily.    desvenlafaxine (PRISTIQ) 100 MG 24 hr tablet    donepezil (ARICEPT) 10 MG tablet Take 1 tablet daily   fluticasone (FLONASE) 50 MCG/ACT nasal spray    gabapentin (NEURONTIN) 100 MG capsule Take 1 capsule twice a day   ibuprofen (ADVIL) 200 MG tablet Take 200 mg by mouth every 6 (six) hours as needed.   losartan (COZAAR) 25 MG tablet Take 25 mg by mouth daily.   Meclizine HCl 25 MG CHEW Chew by mouth.   Melatonin 10 MG CAPS Take 20 mg by mouth at bedtime.   memantine (NAMENDA) 10 MG tablet Take 1 tablet twice a day   montelukast (SINGULAIR) 10 MG tablet    Multiple Vitamins-Minerals (MULTIVITAL) tablet Take 1 tablet by mouth daily.   Omega-3 Fatty Acids (FISH OIL) 1000 MG CAPS Take by mouth.   Phenylephrine-DM-GG-APAP (MUCINEX SINUS-MAX) 5-10-200-325 MG TABS Take by mouth.   silodosin (RAPAFLO) 8 MG CAPS capsule Take 1 capsule (8 mg total) by mouth daily with breakfast.   vitamin B-12 (CYANOCOBALAMIN) 100 MCG tablet Take 100 mcg by mouth daily.   Aspirin-Salicylamide-Caffeine (BC HEADACHE PO) Take by mouth. (Patient not taking: Reported on 03/23/2022)   No facility-administered encounter medications on file as of 03/23/2022.       03/23/2022   11:00 AM 09/19/2021    8:00 AM 09/10/2020   11:00 AM  MMSE - Mini Mental State Exam  Orientation to time '4 4 4  '$ Orientation to Place '5 5 5  '$ Registration '3 3 3  '$ Attention/ Calculation '4 5 4  '$ Recall 1 0 2  Language- name 2 objects '2 2 2  '$ Language- repeat '1 1 1  '$ Language- follow 3 step command '3 3 1  '$ Language- read & follow direction '1 1 1  '$ Write a sentence 0 1 1  Copy design '1 1 1  '$ Total score '25 26 25      '$ 12/16/2018   10:00 AM 05/30/2018    9:00 AM  Montreal Cognitive Assessment   Visuospatial/  Executive (0/5) 3 5  Naming (0/3) 2 1  Attention: Read list of digits (0/2) 2 1  Attention: Read list of letters (0/1) 1 1  Attention: Serial 7 subtraction starting at 100 (0/3) 1 2  Language: Repeat phrase (0/2) 0 0  Language : Fluency (0/1) 0 1  Abstraction (0/2) 0 0  Delayed Recall (0/5) 0 0  Orientation (  0/6) 2 4  Total 11 15    Objective:     PHYSICAL EXAMINATION:    VITALS:   Vitals:   03/23/22 1045  BP: 108/79  Pulse: 89  Resp: 20  SpO2: 95%  Weight: 183 lb (83 kg)  Height: '5\' 8"'$  (1.727 m)    GEN:  The patient appears stated age and is in NAD. HEENT:  Normocephalic, atraumatic.   Neurological examination:  General: NAD, well-groomed, appears stated age. Orientation: The patient is alert. Oriented to person, place and date Cranial nerves: There is good facial symmetry.flat affect.  The speech is fluent and clear. No aphasia or dysarthria. Fund of knowledge is appropriate. Recent and remote memory are impaired. Attention and concentration are reduced.  Able to name objects and repeat phrases.  Hearing is intact to conversational tone.    Sensation: Sensation is intact to light touch throughout Motor: Strength is at least antigravity x4. DTR's 2/4 in UE/LE     Movement examination: Tone: There is normal tone in the UE/LE Abnormal movements: Very mild left greater than right intention tremor no myoclonus.  No asterixis.   Coordination:  There is no decremation with RAM's. Normal finger to nose  Gait and Station: The patient has no difficulty arising out of a deep-seated chair without the use of the hands. The patient's stride length is good.  Gait is cautious and narrow.    Thank you for allowing [place] the opportunity to participate in the care of this nice patient. Please do not hesitate to contact us for any questions or concerns.   Total time spent on today's visit was 28 minutes dedicated to this patient today, preparing to see patient, examining the patient,  ordering tests and/or medications and counseling the patient, documenting clinical information in the EHR or other health record, independently interpreting results and communicating results to the patient/family, discussing treatment and goals, answering patient's questions and coordinating care.  Cc:  Lanelle Bal, PA-C  Sharene Butters 03/23/2022 11:20 AM

## 2022-04-04 DIAGNOSIS — F332 Major depressive disorder, recurrent severe without psychotic features: Secondary | ICD-10-CM | POA: Diagnosis not present

## 2022-04-04 DIAGNOSIS — G3184 Mild cognitive impairment, so stated: Secondary | ICD-10-CM | POA: Diagnosis not present

## 2022-04-04 DIAGNOSIS — F419 Anxiety disorder, unspecified: Secondary | ICD-10-CM | POA: Diagnosis not present

## 2022-04-18 DIAGNOSIS — Z6828 Body mass index (BMI) 28.0-28.9, adult: Secondary | ICD-10-CM | POA: Diagnosis not present

## 2022-04-18 DIAGNOSIS — R030 Elevated blood-pressure reading, without diagnosis of hypertension: Secondary | ICD-10-CM | POA: Diagnosis not present

## 2022-04-18 DIAGNOSIS — B370 Candidal stomatitis: Secondary | ICD-10-CM | POA: Diagnosis not present

## 2022-04-18 DIAGNOSIS — K1379 Other lesions of oral mucosa: Secondary | ICD-10-CM | POA: Diagnosis not present

## 2022-04-18 DIAGNOSIS — J0101 Acute recurrent maxillary sinusitis: Secondary | ICD-10-CM | POA: Diagnosis not present

## 2022-06-07 DIAGNOSIS — R682 Dry mouth, unspecified: Secondary | ICD-10-CM | POA: Diagnosis not present

## 2022-06-07 DIAGNOSIS — R030 Elevated blood-pressure reading, without diagnosis of hypertension: Secondary | ICD-10-CM | POA: Diagnosis not present

## 2022-06-07 DIAGNOSIS — B370 Candidal stomatitis: Secondary | ICD-10-CM | POA: Diagnosis not present

## 2022-06-07 DIAGNOSIS — Z6828 Body mass index (BMI) 28.0-28.9, adult: Secondary | ICD-10-CM | POA: Diagnosis not present

## 2022-06-07 DIAGNOSIS — M62838 Other muscle spasm: Secondary | ICD-10-CM | POA: Diagnosis not present

## 2022-06-07 DIAGNOSIS — K146 Glossodynia: Secondary | ICD-10-CM | POA: Diagnosis not present

## 2022-06-29 DIAGNOSIS — J069 Acute upper respiratory infection, unspecified: Secondary | ICD-10-CM | POA: Diagnosis not present

## 2022-06-29 DIAGNOSIS — B370 Candidal stomatitis: Secondary | ICD-10-CM | POA: Diagnosis not present

## 2022-06-29 DIAGNOSIS — R030 Elevated blood-pressure reading, without diagnosis of hypertension: Secondary | ICD-10-CM | POA: Diagnosis not present

## 2022-06-29 DIAGNOSIS — Z6827 Body mass index (BMI) 27.0-27.9, adult: Secondary | ICD-10-CM | POA: Diagnosis not present

## 2022-07-03 ENCOUNTER — Other Ambulatory Visit: Payer: Medicare PPO

## 2022-07-03 DIAGNOSIS — R972 Elevated prostate specific antigen [PSA]: Secondary | ICD-10-CM | POA: Diagnosis not present

## 2022-07-04 LAB — PSA: Prostate Specific Ag, Serum: 5.6 ng/mL — ABNORMAL HIGH (ref 0.0–4.0)

## 2022-07-10 ENCOUNTER — Ambulatory Visit: Payer: Medicare PPO | Admitting: Urology

## 2022-07-10 ENCOUNTER — Encounter: Payer: Self-pay | Admitting: Urology

## 2022-07-10 VITALS — BP 137/94 | HR 84

## 2022-07-10 DIAGNOSIS — R351 Nocturia: Secondary | ICD-10-CM

## 2022-07-10 DIAGNOSIS — R972 Elevated prostate specific antigen [PSA]: Secondary | ICD-10-CM

## 2022-07-10 DIAGNOSIS — N401 Enlarged prostate with lower urinary tract symptoms: Secondary | ICD-10-CM | POA: Diagnosis not present

## 2022-07-10 DIAGNOSIS — N138 Other obstructive and reflux uropathy: Secondary | ICD-10-CM | POA: Diagnosis not present

## 2022-07-10 LAB — URINALYSIS, ROUTINE W REFLEX MICROSCOPIC
Bilirubin, UA: NEGATIVE
Glucose, UA: NEGATIVE
Ketones, UA: NEGATIVE
Leukocytes,UA: NEGATIVE
Nitrite, UA: NEGATIVE
Specific Gravity, UA: 1.025 (ref 1.005–1.030)
Urobilinogen, Ur: 0.2 mg/dL (ref 0.2–1.0)
pH, UA: 6 (ref 5.0–7.5)

## 2022-07-10 LAB — MICROSCOPIC EXAMINATION: Bacteria, UA: NONE SEEN

## 2022-07-10 MED ORDER — SILODOSIN 8 MG PO CAPS: 8.0000 mg | ORAL_CAPSULE | Freq: Every day | ORAL | 3 refills | Status: DC

## 2022-07-10 NOTE — Patient Instructions (Signed)

## 2022-07-10 NOTE — Progress Notes (Unsigned)
07/10/2022 9:58 AM   Chris [name]. [birth year] [id number]  Referring provider: Lanelle Bal, PA-C [place],  [place] [id number]  No chief complaint on file.   HPI: PDS 5.6. IPSS 8 QOL 2 on rapaflo 8mg .    PMH: Past Medical History:  Diagnosis Date   Anxiety    Chest pain, unspecified    Depression    Dyslexia    Hearing loss    Attributed to recurring sinus infections   Seasonal allergies    Stroke ([place])    Right lateral temporal lobe   Unspecified essential hypertension     Surgical History: Past Surgical History:  Procedure Laterality Date   HIP ARTHROPLASTY     TOTAL KNEE ARTHROPLASTY      Home Medications:  Allergies as of 07/10/2022   No Known Allergies      Medication List        Accurate as of July 10, 2022  9:58 AM. If you have any questions, ask your nurse or doctor.          acetaminophen 325 MG tablet Commonly known as: TYLENOL Take 650 mg by mouth every 6 (six) hours as needed.   BC HEADACHE PO Take by mouth.   buPROPion 150 MG 24 hr tablet Commonly known as: WELLBUTRIN XL   busPIRone 15 MG tablet Commonly known as: BUSPAR Take 15 mg by mouth 3 (three) times daily.   desvenlafaxine 100 MG 24 hr tablet Commonly known as: PRISTIQ   donepezil 10 MG tablet Commonly known as: ARICEPT Take 1 tablet daily   Fish Oil 1000 MG Caps Take by mouth.   fluticasone 50 MCG/ACT nasal spray Commonly known as: FLONASE   gabapentin 100 MG capsule Commonly known as: Neurontin Take 1 capsule twice a day   ibuprofen 200 MG tablet Commonly known as: ADVIL Take 200 mg by mouth every 6 (six) hours as needed.   losartan 25 MG tablet Commonly known as: COZAAR Take 25 mg by mouth daily.   Meclizine HCl 25 MG Chew Chew by mouth.   Melatonin 10 MG Caps Take 20 mg by mouth at bedtime.   memantine 10 MG tablet Commonly known as: Namenda Take 1 tablet twice a day   montelukast 10 MG tablet Commonly known as: SINGULAIR    Mucinex Sinus-Max 5-10-200-325 MG Tabs Generic drug: Phenylephrine-DM-GG-APAP Take by mouth.   Multivital tablet Take 1 tablet by mouth daily.   silodosin 8 MG Caps capsule Commonly known as: RAPAFLO Take 1 capsule (8 mg total) by mouth daily with breakfast.   vitamin B-12 100 MCG tablet Commonly known as: CYANOCOBALAMIN Take 100 mcg by mouth daily.        Allergies: No Known Allergies  Family History: Family History  Problem Relation Age of Onset   Heart disease Mother    Cancer Father     Social History:  reports that he quit smoking about 52 years ago. He has a 1.00 pack-year smoking history. He has never used smokeless tobacco. He reports current alcohol use. He reports that he does not use drugs.  ROS: All other review of systems were reviewed and are negative except what is noted above in HPI  Physical Exam: BP (!) 137/94   Pulse 84   Constitutional:  Alert and oriented, No acute distress. HEENT: [place] AT, moist mucus membranes.  Trachea midline, no masses. Cardiovascular: No clubbing, cyanosis, or edema. Respiratory: Normal respiratory effort, no increased work of breathing. GI: Abdomen  is soft, nontender, nondistended, no abdominal masses GU: No CVA tenderness.  Lymph: No cervical or inguinal lymphadenopathy. Skin: No rashes, bruises or suspicious lesions. Neurologic: Grossly intact, no focal deficits, moving all 4 extremities. Psychiatric: Normal mood and affect.  Laboratory Data: Lab Results  Component Value Date   WBC 5.7 01/18/2008   HGB 13.8 01/18/2008   HCT 39.8 01/18/2008   MCV 92.6 01/18/2008   PLT 159 01/18/2008    Lab Results  Component Value Date   CREATININE 0.88 01/18/2008    No results found for: "PSA"  No results found for: "TESTOSTERONE"  No results found for: "HGBA1C"  Urinalysis    Component Value Date/Time   APPEARANCEUR Clear 07/11/2021 1111   GLUCOSEU Negative 07/11/2021 1111   BILIRUBINUR Negative 07/11/2021 1111    PROTEINUR Negative 07/11/2021 1111   UROBILINOGEN 0.2 08/04/2019 1340   NITRITE Negative 07/11/2021 1111   LEUKOCYTESUR Negative 07/11/2021 1111    Lab Results  Component Value Date   LABMICR Comment 07/11/2021   WBCUA None seen 07/09/2020   LABEPIT None seen 07/09/2020   MUCUS Present 02/04/2020   BACTERIA None seen 07/09/2020    Pertinent Imaging: *** No results found for this or any previous visit.  No results found for this or any previous visit.  No results found for this or any previous visit.  No results found for this or any previous visit.  Results for orders placed during the hospital encounter of 01/04/21  Ultrasound renal complete  Narrative CLINICAL DATA:  Nephrolithiasis.  EXAM: RENAL / URINARY TRACT ULTRASOUND COMPLETE  COMPARISON:  January 22, 2020.  FINDINGS: Right Kidney:  Renal measurements: 9.0 x 5.6 x 4.4 cm = volume: 115 mL. Echogenicity within normal limits. No mass or hydronephrosis visualized.  Left Kidney:  Renal measurements: 11.0 x 5.6 x 4.9 cm = volume: 158 mL. Echogenicity within normal limits. No mass or hydronephrosis visualized.  Bladder:  Not well distended and therefore not well visualized. Patient reportedly voided prior to exam.  Other:  None.  IMPRESSION: No renal abnormality is noted.   Electronically Signed By: Marijo Conception M.D. On: 01/05/2021 09:59  No valid procedures specified. No results found for this or any previous visit.  No results found for this or any previous visit.   Assessment & Plan:    1. Elevated PSA *** - Urinalysis, Routine w reflex microscopic  2. Benign prostatic hyperplasia with urinary obstruction ***  3. Nocturia ***   No follow-ups on file.  Nicolette Bang, MD  [facility] [place]

## 2022-09-15 ENCOUNTER — Encounter: Payer: Self-pay | Admitting: Neurology

## 2022-09-15 ENCOUNTER — Ambulatory Visit: Payer: Medicare PPO | Admitting: Neurology

## 2022-09-15 VITALS — BP 106/59 | HR 88 | Ht 68.0 in | Wt 178.2 lb

## 2022-09-15 DIAGNOSIS — R519 Headache, unspecified: Secondary | ICD-10-CM | POA: Diagnosis not present

## 2022-09-15 DIAGNOSIS — F0390 Unspecified dementia without behavioral disturbance: Secondary | ICD-10-CM

## 2022-09-15 MED ORDER — DONEPEZIL HCL 10 MG PO TABS: ORAL_TABLET | ORAL | 3 refills | Status: DC

## 2022-09-15 MED ORDER — GABAPENTIN 100 MG PO CAPS: ORAL_CAPSULE | ORAL | 3 refills | Status: DC

## 2022-09-15 MED ORDER — MEMANTINE HCL 10 MG PO TABS: ORAL_TABLET | ORAL | 3 refills | Status: DC

## 2022-09-15 NOTE — Progress Notes (Signed)
NEUROLOGY FOLLOW UP OFFICE NOTE  Chris [name] [id number] 1945/10/11  HISTORY OF PRESENT ILLNESS: I had the pleasure of seeing Chris [name]. in follow-up in the neurology clinic on 09/15/2022.  The patient was last seen by Memory Disorders PA Marlowe Kays 5 months ago for dementia. He is again accompanied by his wife who helps supplement the history today.  Records and images were personally reviewed where available.  MMSE 25/30 in 03/2022. He is on Donepezil 10mg  daily and Memantine 10mg  BID without side effects. He states his memory is not so good. His wife notes it has gotten a lot worse since last visit. He has difficulty understanding the meaning of common words. He can't remember names of family members. She manages medications, finances, meals. He does not drive. He is independent with dressing and bathing, he feeds himself. Appetite is good. No paranoia or hallucinations. Mood is up and down, "depends on time of the day." He states mood is "still kind of low." There is not a lot of anxiety. He follows up with Psychiatry. He mostly watches TV and naps, they try to go out every afternoon to walk.   He continues to report a lot of small headaches daily, feeling a pressure all over his head. He feels this affects his hearing really bad today. His wife feels the headaches are more related to his sinuses, with the increase in Gabapentin to 100mg  BID, he is not having as many bad headaches as he used to. No side effects on medication. BP initially today was 80/56, he feels dizzy a lot. They will discuss BP medication with PCP. Repeat BP after hydration was 106/59, his wife feels he does not drink a lot of water, he likes Dr. Reino Kent more. No falls.   History on Initial Assessment 05/30/2018: This is a 77 year old right-handed man with a history of diet-controlled hypertension, depression, anxiety, presenting for evaluation of worsening memory. He states his memory is good and bad at certain  things, she cannot recall what he did yesterday, he would not recall the date and have to look at his calendar. He denies getting lost driving. His wife occasionally reminds him to take his medications. He has frequent headaches and wants to take over the counter medication all day long. His wife manages bills. His wife states he has never been one to remember names, but in the past 6 months, she has noticed he would have difficulty thinking of the name of an object and get dates and times confused. He would ask her 5 times in one day what time his doctor appointment was. She denies any significant driving concerns. She has noticed that he gets really nervous and shaky around 2pm, then calms down by 8pm. Sleep is good, no wandering behavior. He still feels drowsy during the day. No paranoia or hallucinations. They report he had failed several grades in school, joined the [employer], then went to [employer] where he had a GPA of 3.85. It was in college where he was formally diagnosed with dyslexia. 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 There is no family history of dementia. No history of significant head injuries. He drinks 1 big can of beer a week, sometimes he drinks 3-4 times a week.    He has had daily headaches for several years and saw neurologist Dr. Neale Burly in 2018. He had trigger point injections and became headache-free for a while, until he had a fall with  loss of consciousness last September 2019 and headaches recurred. He has a moderate ache in the frontal region constantly, no nausea/vomiting, photo/phonophobia. He has been taking Tylenol and BC powders but states he does not take it quite everyday. He has dizziness with lightheadedness sometimes lasting all day until he takes meclizine which helps. He denies any diplopia, dysarthria/dysphagia, back pain, bladder dysfunction, anosmia. He has occasional neck pain and constipation. He started noticing bilateral hand tremors 2-3  years ago which do not affect writing or using utensils. He fell while walking on a trail last September, he recalls starting feeling bad in his stomach and started walking faster, then passed out. He feels he was only out for a second, he does not remember hitting the ground but stood up and and realized he had fallen down. He had significant injury on the left eyelid and left side of his face and underwent plastic surgery. He still has stitches on the left lateral eyelid. He has occasional numbness and tingling in his right hand, it feels cold sometimes. He has noticed difficulty buttoning or tying shoelaces with his right hand. His wife has noticed shuffling when walking over the past year.   I personally reviewed MRI brain without contrast done 11/27/2016 which did not show any acute changes. There was scattered foci of susceptibility are present over the posterior left temporal lobe and occipital lobe, increased dural vasculature is suggested on axial image 8 of series 7. 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 There was moderate diffuse atrophy and mild chronic microvascular disease  I personally reviewed MRI brain with and without contrast done February 2020 which did not show any acute changes.There was stable small chronic infarct within the right lateral temporal lobe, stable mild chronic microvascular disease, moderate diffuse volume loss with prominent volume loss in the anteromedial temporal lobes and parahippocampal gyri (which can be seen with Alzheimer's or frontotemporal lobar degeneration). There were numerous foci of chronic microhemorrhages predominantly in the posterior distribution, greater on the left (favoring amyloid angiopathy).   Neuropsychological testing in 02/2019 showed Major Neurocognitive Disorder (dementia), mild end of spectrum. Etiology likely multifactorial, vascular and Alzheimer's disease.   TSH and B12 unremarkable.     PAST MEDICAL HISTORY: Past  Medical History:  Diagnosis Date   Anxiety    Chest pain, unspecified    Depression    Dyslexia    Hearing loss    Attributed to recurring sinus infections   Seasonal allergies    Stroke [facility])    Right lateral temporal lobe   Unspecified essential hypertension     MEDICATIONS: Current Outpatient Medications on File Prior to Visit  Medication Sig Dispense Refill   acetaminophen (TYLENOL) 325 MG tablet Take 650 mg by mouth every 6 (six) hours as needed.     buPROPion (WELLBUTRIN XL) 150 MG 24 hr tablet      busPIRone (BUSPAR) 15 MG tablet Take 15 mg by mouth 3 (three) times daily.      desvenlafaxine (PRISTIQ) 100 MG 24 hr tablet      donepezil (ARICEPT) 10 MG tablet Take 1 tablet daily 90 tablet 3   fluticasone (FLONASE) 50 MCG/ACT nasal spray      gabapentin (NEURONTIN) 100 MG capsule Take 1 capsule twice a day 180 capsule 3   ibuprofen (ADVIL) 200 MG tablet Take 200 mg by mouth every 6 (six) hours as needed.     losartan (COZAAR) 25 MG tablet Take 25 mg by mouth daily.  Meclizine HCl 25 MG CHEW Chew by mouth.     Melatonin 10 MG CAPS Take 20 mg by mouth at bedtime.     memantine (NAMENDA) 10 MG tablet Take 1 tablet twice a day 180 tablet 3   montelukast (SINGULAIR) 10 MG tablet      Multiple Vitamins-Minerals (MULTIVITAL) tablet Take 1 tablet by mouth daily.     Omega-3 Fatty Acids (FISH OIL) 1000 MG CAPS Take by mouth.     Phenylephrine-DM-GG-APAP (MUCINEX SINUS-MAX) 5-10-200-325 MG TABS Take by mouth.     silodosin (RAPAFLO) 8 MG CAPS capsule Take 1 capsule (8 mg total) by mouth daily with breakfast. 90 capsule 3   vitamin B-12 (CYANOCOBALAMIN) 100 MCG tablet Take 100 mcg by mouth daily.     No current facility-administered medications on file prior to visit.    ALLERGIES: No Known Allergies  FAMILY HISTORY: Family History  Problem Relation Age of Onset   Heart disease Mother    Cancer Father     SOCIAL HISTORY: Social History   Socioeconomic History    Marital status: Married    Spouse name: Not on file   Number of children: 2   Years of education: 16   Highest education level: Bachelor's degree (e.g., BA, AB, BS)  Occupational History   Not on file  Tobacco Use   Smoking status: Former    Packs/day: 0.50    Years: 2.00    Additional pack years: 0.00    Total pack years: 1.00    Types: Cigarettes    Quit date: 08/03/1969    Years since quitting: 53.1   Smokeless tobacco: Never  Vaping Use   Vaping Use: Never used  Substance and Sexual Activity   Alcohol use: Yes    Comment: rare   Drug use: No   Sexual activity: Yes  Other Topics Concern   Not on file  Social History Narrative   Pt is right handed   Lives in single story home with his wife, Chris [name]   Has 2 adult children   Bachelors degree in Chartered loss adjuster   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 No aphasia or dysarthria. Fund of knowledge is appropriate.  Attention and concentration are normal.   Cranial nerves: Pupils equal, round. Extraocular movements intact with no nystagmus. Visual fields full.  No facial asymmetry.  Hard of hearing, hearing aide on left ear. Motor: Bulk and tone normal, muscle strength 5/5 throughout with no pronator drift.   Finger to nose testing intact.  Gait narrow-based and steady, no ataxia. Mild bilateral high frequency low amplitude postural tremor, no resting or action tremor seen.    IMPRESSION: This is a 77 yo RH man  with a history of diet-controlled hypertension, depression, anxiety, and mild dementia. Neuropsychological testing in 02/2019 indicated Major Neurocognitive Disorder (ie dementia), mild, likely multifactorial, vascular and Alzheimer's disease. MRI brain no acute changes, there was note of temporal lobe atrophy as  well as chronic microhemorrhages. MMSE 25/30 in 03/2022. His wife notes more cognitive decline in the past 6 months, we discussed how depression can also affect memory. Increase physical activity, socialization, brain stimulation activities. Continue follow-up with Psychiatry. There has been some improvement in headaches with Gabapentin but he continues to report daily mild headaches, increase Gabapentin to 100mg  in AM, 200mg  in PM. Discuss continued need for BP medication with PCP. Continue close supervision. He does not drive. Follow-up with Memory Disorders PA Marlowe Kays in 6 months, call for any changes.      Thank you for allowing me to participate in his care.  Please do not hesitate to call for any questions or concerns.   Patrcia Dolly, M.D.   CC: Lianne Moris, PA-C

## 2022-09-15 NOTE — Patient Instructions (Signed)
Good to see you.  Increase Gabapentin 100mg : take 1 capsule in AM, 2 capsules in PM  2. Continue Donepezil 10mg  daily and Memantine 10mg  twice a day  3. Discuss need for BP medication with PCP  4. Continue with regular exercise, increasing other activities aside from watching TV  5. Follow-up with Memory Disorders PA Marlowe Kays in 6 months, call for any changes   FALL PRECAUTIONS: Be cautious when walking. Scan the area for obstacles that may increase the risk of trips and falls. When getting up in the mornings, sit up at the edge of the bed for a few minutes before getting out of bed. Consider elevating the bed at the head end to avoid drop of blood pressure when getting up. Walk always in a well-lit room (use night lights in the walls). Avoid area rugs or power cords from appliances in the middle of the walkways. Use a walker or a cane if necessary and consider physical therapy for balance exercise. Get your eyesight checked regularly.  HOME SAFETY: Consider the safety of the kitchen when operating appliances like stoves, microwave oven, and blender. Consider having supervision and share cooking responsibilities until no longer able to participate in those. Accidents with firearms and other hazards in the house should be identified and addressed as well.  ABILITY TO BE LEFT ALONE: If patient is unable to contact 911 operator, consider using LifeLine, or when the need is there, arrange for someone to stay with patients. Smoking is a fire hazard, consider supervision or cessation. Risk of wandering should be assessed by caregiver and if detected at any point, supervision and safe proof recommendations should be instituted.   RECOMMENDATIONS FOR ALL PATIENTS WITH MEMORY PROBLEMS: 1. Continue to exercise (Recommend 30 minutes of walking everyday, or 3 hours every week) 2. Increase social interactions - continue going to [place] and enjoy social gatherings with friends and family 3. Eat healthy,  avoid fried foods and eat more fruits and vegetables 4. Maintain adequate blood pressure, blood sugar, and blood cholesterol level. Reducing the risk of stroke and cardiovascular disease also helps promoting better memory. 5. Avoid stressful situations. Live a simple life and avoid aggravations. Organize your time and prepare for the next day in anticipation. 6. Sleep well, avoid any interruptions of sleep and avoid any distractions in the bedroom that may interfere with adequate sleep quality 7. Avoid sugar, avoid sweets as there is a strong link between excessive sugar intake, diabetes, and cognitive impairment The Mediterranean diet has been shown to help patients reduce the risk of progressive memory disorders and reduces cardiovascular risk. This includes eating fish, eat fruits and green leafy vegetables, nuts like almonds and hazelnuts, walnuts, and also use olive oil. Avoid fast foods and fried foods as much as possible. Avoid sweets and sugar as sugar use has been linked to worsening of memory function.  There is always a concern of gradual progression of memory problems. If this is the case, then we may need to adjust level of care according to patient needs. Support, both to the patient and caregiver, should then be put into place.

## 2022-09-20 DIAGNOSIS — R030 Elevated blood-pressure reading, without diagnosis of hypertension: Secondary | ICD-10-CM | POA: Diagnosis not present

## 2022-09-20 DIAGNOSIS — Z6827 Body mass index (BMI) 27.0-27.9, adult: Secondary | ICD-10-CM | POA: Diagnosis not present

## 2022-09-20 DIAGNOSIS — K1379 Other lesions of oral mucosa: Secondary | ICD-10-CM | POA: Diagnosis not present

## 2022-09-25 ENCOUNTER — Ambulatory Visit: Payer: Medicare PPO | Admitting: Physician Assistant

## 2022-10-02 DIAGNOSIS — G3184 Mild cognitive impairment, so stated: Secondary | ICD-10-CM | POA: Diagnosis not present

## 2022-10-02 DIAGNOSIS — F419 Anxiety disorder, unspecified: Secondary | ICD-10-CM | POA: Diagnosis not present

## 2022-10-02 DIAGNOSIS — F332 Major depressive disorder, recurrent severe without psychotic features: Secondary | ICD-10-CM | POA: Diagnosis not present

## 2022-12-26 DIAGNOSIS — R5383 Other fatigue: Secondary | ICD-10-CM | POA: Diagnosis not present

## 2022-12-26 DIAGNOSIS — Z13220 Encounter for screening for lipoid disorders: Secondary | ICD-10-CM | POA: Diagnosis not present

## 2022-12-26 DIAGNOSIS — Z0001 Encounter for general adult medical examination with abnormal findings: Secondary | ICD-10-CM | POA: Diagnosis not present

## 2022-12-26 DIAGNOSIS — E7800 Pure hypercholesterolemia, unspecified: Secondary | ICD-10-CM | POA: Diagnosis not present

## 2022-12-26 DIAGNOSIS — Z1329 Encounter for screening for other suspected endocrine disorder: Secondary | ICD-10-CM | POA: Diagnosis not present

## 2022-12-26 DIAGNOSIS — R519 Headache, unspecified: Secondary | ICD-10-CM | POA: Diagnosis not present

## 2022-12-29 DIAGNOSIS — F419 Anxiety disorder, unspecified: Secondary | ICD-10-CM | POA: Diagnosis not present

## 2022-12-29 DIAGNOSIS — I10 Essential (primary) hypertension: Secondary | ICD-10-CM | POA: Diagnosis not present

## 2022-12-29 DIAGNOSIS — M545 Low back pain, unspecified: Secondary | ICD-10-CM | POA: Diagnosis not present

## 2022-12-29 DIAGNOSIS — Z6827 Body mass index (BMI) 27.0-27.9, adult: Secondary | ICD-10-CM | POA: Diagnosis not present

## 2022-12-29 DIAGNOSIS — F0390 Unspecified dementia without behavioral disturbance: Secondary | ICD-10-CM | POA: Diagnosis not present

## 2022-12-29 DIAGNOSIS — Z0000 Encounter for general adult medical examination without abnormal findings: Secondary | ICD-10-CM | POA: Diagnosis not present

## 2023-01-03 ENCOUNTER — Other Ambulatory Visit: Payer: Medicare PPO

## 2023-01-03 DIAGNOSIS — R972 Elevated prostate specific antigen [PSA]: Secondary | ICD-10-CM | POA: Diagnosis not present

## 2023-01-04 LAB — PSA: Prostate Specific Ag, Serum: 5.1 ng/mL — ABNORMAL HIGH (ref 0.0–4.0)

## 2023-01-10 ENCOUNTER — Ambulatory Visit: Payer: Medicare PPO | Admitting: Urology

## 2023-01-10 VITALS — BP 113/78 | HR 86

## 2023-01-10 DIAGNOSIS — N138 Other obstructive and reflux uropathy: Secondary | ICD-10-CM

## 2023-01-10 DIAGNOSIS — R972 Elevated prostate specific antigen [PSA]: Secondary | ICD-10-CM

## 2023-01-10 DIAGNOSIS — R351 Nocturia: Secondary | ICD-10-CM

## 2023-01-10 DIAGNOSIS — N401 Enlarged prostate with lower urinary tract symptoms: Secondary | ICD-10-CM | POA: Diagnosis not present

## 2023-01-10 MED ORDER — SILODOSIN 8 MG PO CAPS: 8.0000 mg | ORAL_CAPSULE | Freq: Every day | ORAL | 3 refills | Status: DC

## 2023-01-10 NOTE — Progress Notes (Signed)
01/10/2023 10:35 AM   Chris [name]. 1945-10-16 [id number]  Referring provider: Lianne Moris, PA-C [address] [place],  [place] [address]  No chief complaint on file.   HPI: Mr Miyagi is a 77yo here for followup for elevated PSA and BPh with nocturia. PSA decreased to 5.1 from 5.6. IPSS 9 QOL 2 on rapaflo 8mg . Nocturia 1-2x depending on fluid consumption. Urine stream strong. NO straining to urinate. No other complaints today   PMH: Past Medical History:  Diagnosis Date   Anxiety    Chest pain, unspecified    Depression    Dyslexia    Hearing loss    Attributed to recurring sinus infections   Seasonal allergies    Stroke (HCC)    Right lateral temporal lobe   Unspecified essential hypertension     Surgical History: Past Surgical History:  Procedure Laterality Date   HIP ARTHROPLASTY     TOTAL KNEE ARTHROPLASTY      Home Medications:  Allergies as of 01/10/2023   No Known Allergies      Medication List        Accurate as of January 10, 2023 10:35 AM. If you have any questions, ask your nurse or doctor.          acetaminophen 325 MG tablet Commonly known as: TYLENOL Take 650 mg by mouth every 6 (six) hours as needed.   buPROPion 150 MG 24 hr tablet Commonly known as: WELLBUTRIN XL   busPIRone 15 MG tablet Commonly known as: BUSPAR Take 15 mg by mouth 3 (three) times daily.   desvenlafaxine 100 MG 24 hr tablet Commonly known as: PRISTIQ   donepezil 10 MG tablet Commonly known as: ARICEPT Take 1 tablet daily   Fish Oil 1000 MG Caps Take by mouth.   fluticasone 50 MCG/ACT nasal spray Commonly known as: FLONASE   gabapentin 100 MG capsule Commonly known as: Neurontin Take 1 capsule in AM, 2 capsules in PM   ibuprofen 200 MG tablet Commonly known as: ADVIL Take 200 mg by mouth every 6 (six) hours as needed.   losartan 25 MG tablet Commonly known as: COZAAR Take 25 mg by mouth daily.   Meclizine HCl 25 MG Chew Chew by mouth.    Melatonin 10 MG Caps Take 20 mg by mouth at bedtime.   memantine 10 MG tablet Commonly known as: Namenda Take 1 tablet twice a day   montelukast 10 MG tablet Commonly known as: SINGULAIR   Mucinex Sinus-Max 5-10-200-325 MG Tabs Generic drug: Phenylephrine-DM-GG-APAP Take by mouth.   Multivital tablet Take 1 tablet by mouth daily.   silodosin 8 MG Caps capsule Commonly known as: RAPAFLO Take 1 capsule (8 mg total) by mouth daily with breakfast.   vitamin B-12 100 MCG tablet Commonly known as: CYANOCOBALAMIN Take 100 mcg by mouth daily.        Allergies: No Known Allergies  Family History: Family History  Problem Relation Age of Onset   Heart disease Mother    Cancer Father     Social History:  reports that he quit smoking about 53 years ago. He started smoking about 55 years ago. He has a 1 pack-year smoking history. He has never used smokeless tobacco. He reports current alcohol use. He reports that he does not use drugs.  ROS: All other review of systems were reviewed and are negative except what is noted above in HPI  Physical Exam: BP 113/78   Pulse 86   Constitutional:  Alert and oriented, No acute distress. HEENT: [place] AT, moist mucus membranes.  Trachea midline, no masses. Cardiovascular: No clubbing, cyanosis, or edema. Respiratory: Normal respiratory effort, no increased work of breathing. GI: Abdomen is soft, nontender, nondistended, no abdominal masses GU: No CVA tenderness.  Lymph: No cervical or inguinal lymphadenopathy. Skin: No rashes, bruises or suspicious lesions. Neurologic: Grossly intact, no focal deficits, moving all 4 extremities. Psychiatric: Normal mood and affect.  Laboratory Data: Lab Results  Component Value Date   WBC 5.7 01/18/2008   HGB 13.8 01/18/2008   HCT 39.8 01/18/2008   MCV 92.6 01/18/2008   PLT 159 01/18/2008    Lab Results  Component Value Date   CREATININE 0.88 01/18/2008    No results found for: "PSA"  No  results found for: "TESTOSTERONE"  No results found for: "HGBA1C"  Urinalysis    Component Value Date/Time   APPEARANCEUR Clear 07/10/2022 0930   GLUCOSEU Negative 07/10/2022 0930   BILIRUBINUR Negative 07/10/2022 0930   PROTEINUR 1+ (A) 07/10/2022 0930   UROBILINOGEN 0.2 08/04/2019 1340   NITRITE Negative 07/10/2022 0930   LEUKOCYTESUR Negative 07/10/2022 0930    Lab Results  Component Value Date   LABMICR See below: 07/10/2022   WBCUA 0-5 07/10/2022   LABEPIT 0-10 07/10/2022   MUCUS Present 02/04/2020   BACTERIA None seen 07/10/2022    Pertinent Imaging:  No results found for this or any previous visit.  No results found for this or any previous visit.  No results found for this or any previous visit.  No results found for this or any previous visit.  Results for orders placed during the hospital encounter of 01/04/21  Ultrasound renal complete  Narrative CLINICAL DATA:  Nephrolithiasis.  EXAM: RENAL / URINARY TRACT ULTRASOUND COMPLETE  COMPARISON:  January 22, 2020.  FINDINGS: Right Kidney:  Renal measurements: 9.0 x 5.6 x 4.4 cm = volume: 115 mL. Echogenicity within normal limits. No mass or hydronephrosis visualized.  Left Kidney:  Renal measurements: 11.0 x 5.6 x 4.9 cm = volume: 158 mL. Echogenicity within normal limits. No mass or hydronephrosis visualized.  Bladder:  Not well distended and therefore not well visualized. Patient reportedly voided prior to exam.  Other:  None.  IMPRESSION: No renal abnormality is noted.   Electronically Signed By: Lupita Raider M.D. On: 01/05/2021 09:59  No valid procedures specified. No results found for this or any previous visit.  No results found for this or any previous visit.   Assessment & Plan:    1. Elevated PSA -followup 6 months with a PSA - Urinalysis, Routine w reflex microscopic  2. Benign prostatic hyperplasia with urinary obstruction -continue rapalfo 8mg  daily  3.  Nocturia Continue rapaflo 8mg  daily   No follow-ups on file.  Wilkie Aye, MD  [facility] [place]

## 2023-01-14 ENCOUNTER — Encounter: Payer: Self-pay | Admitting: Urology

## 2023-01-14 NOTE — Patient Instructions (Signed)

## 2023-01-18 IMAGING — US US RENAL
1 series · 14 of 25 positions shown · non-contrast
Comparison: January 22, 2020.

CLINICAL DATA: Nephrolithiasis.

EXAM:
RENAL / URINARY TRACT ULTRASOUND COMPLETE

[Series 1: us renal · 14 of 54 slices shown]
[im 1/54]
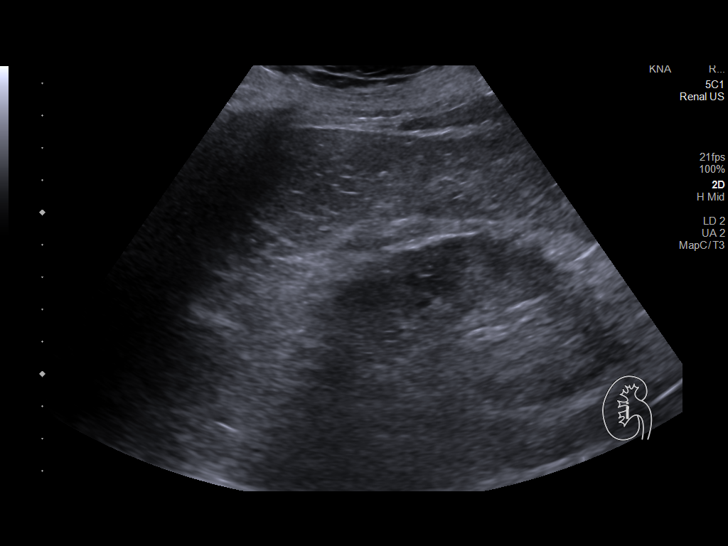
[im 5/54]
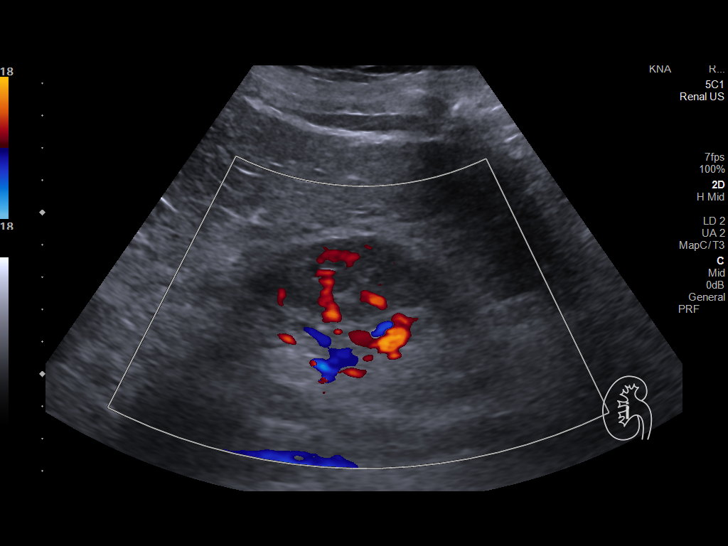
[im 9/54]
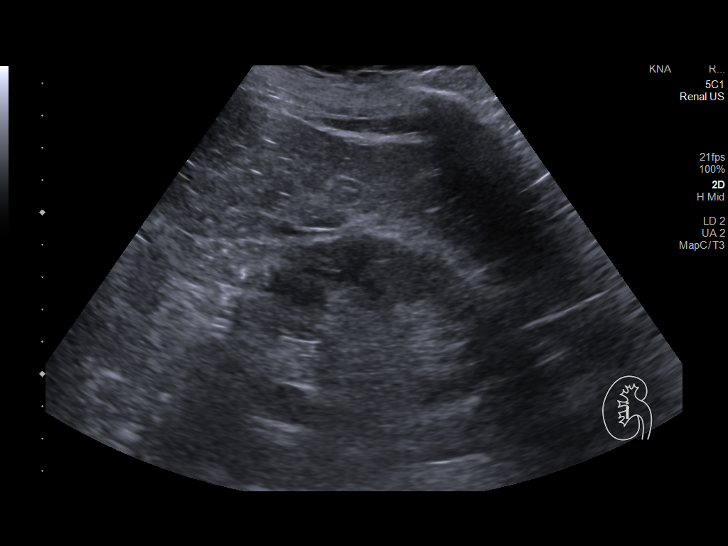
[im 14/54]
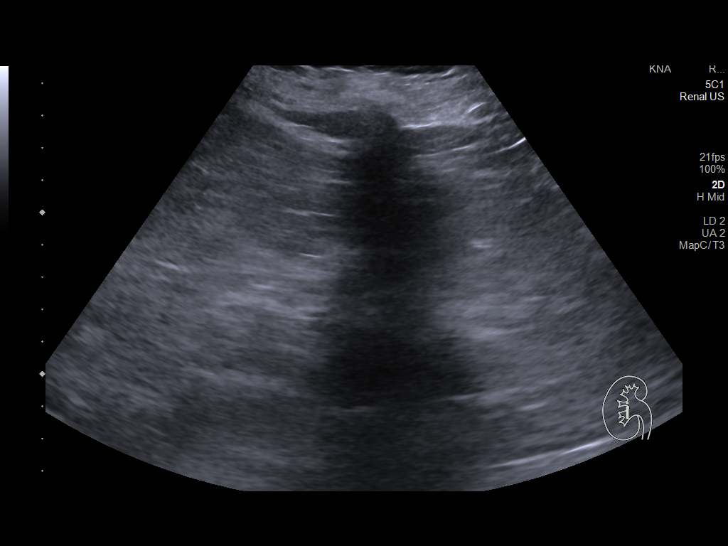
[im 18/54]
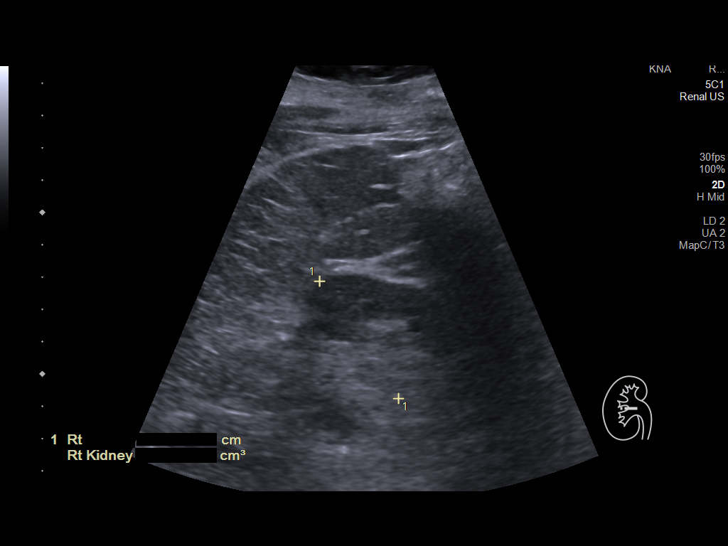
[im 20/54]
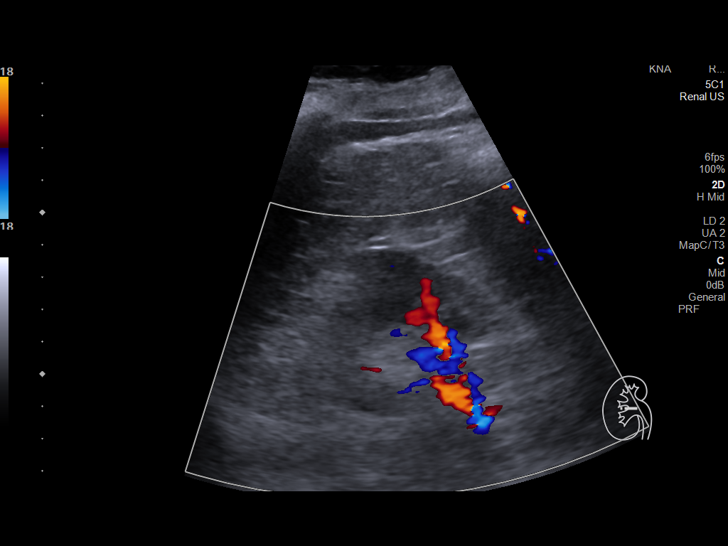
[im 25/54]
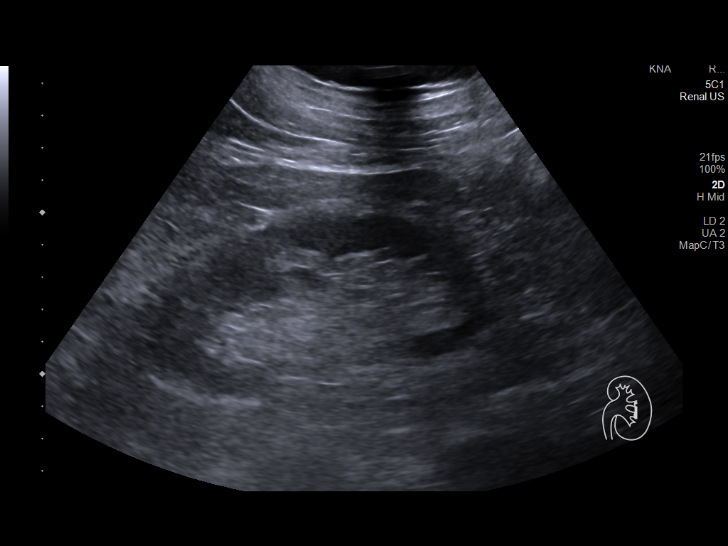
[im 29/54]
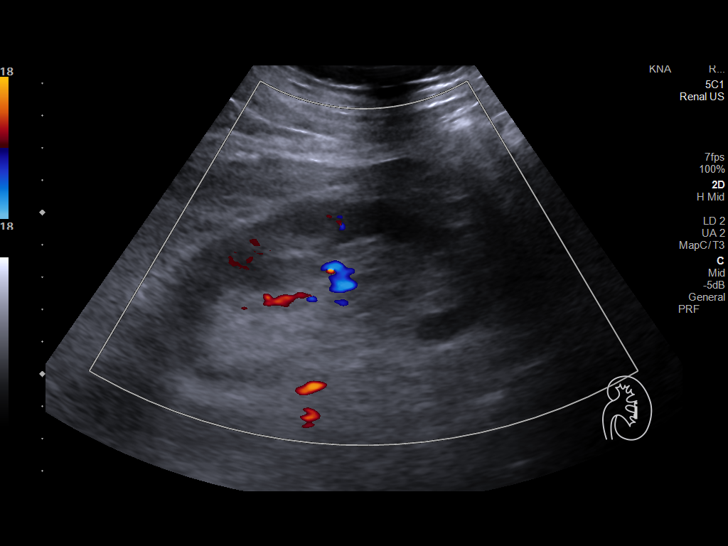
[im 34/54]
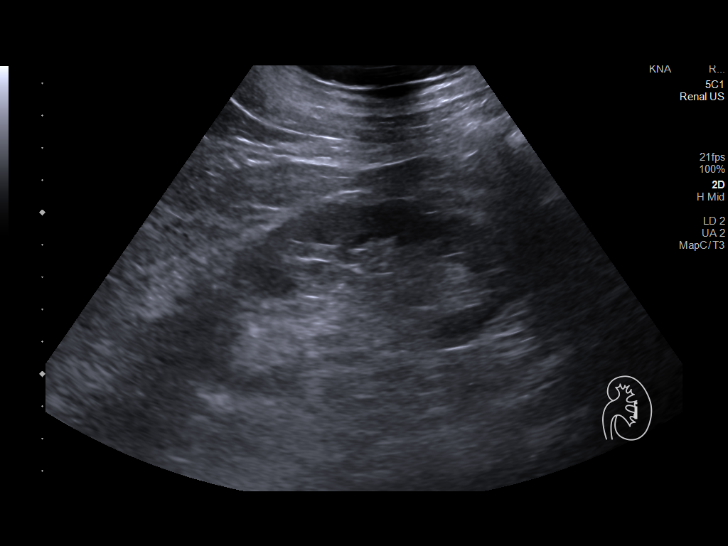
[im 36/54]
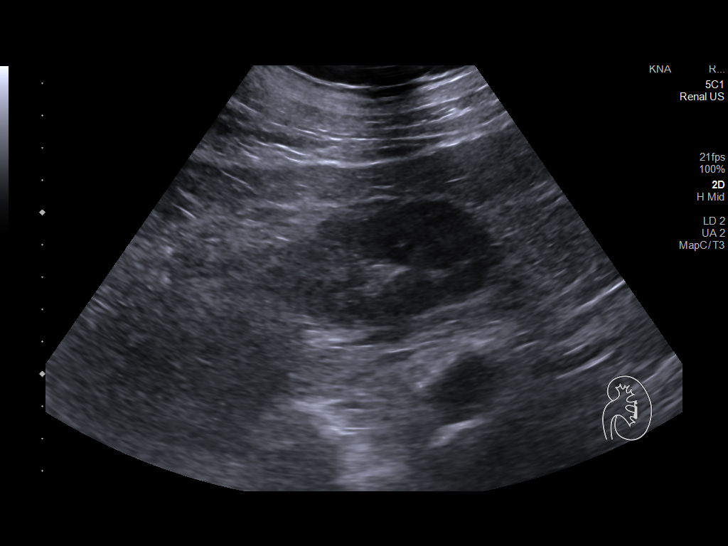
[im 40/54]
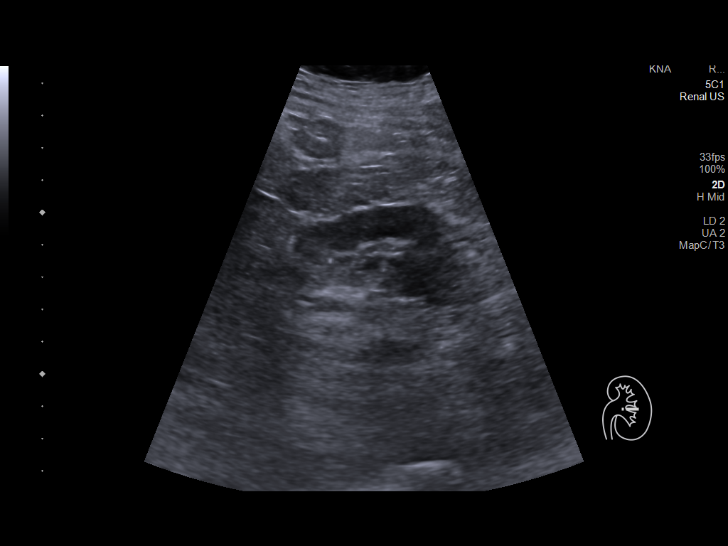
[im 45/54]
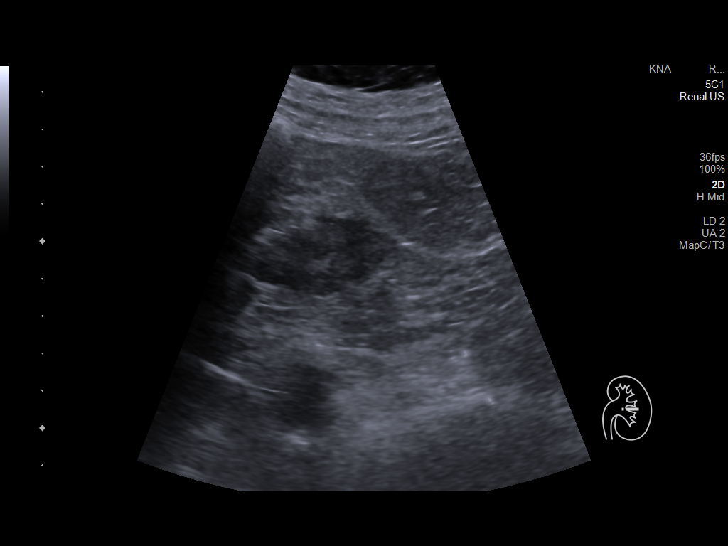
[im 49/54]
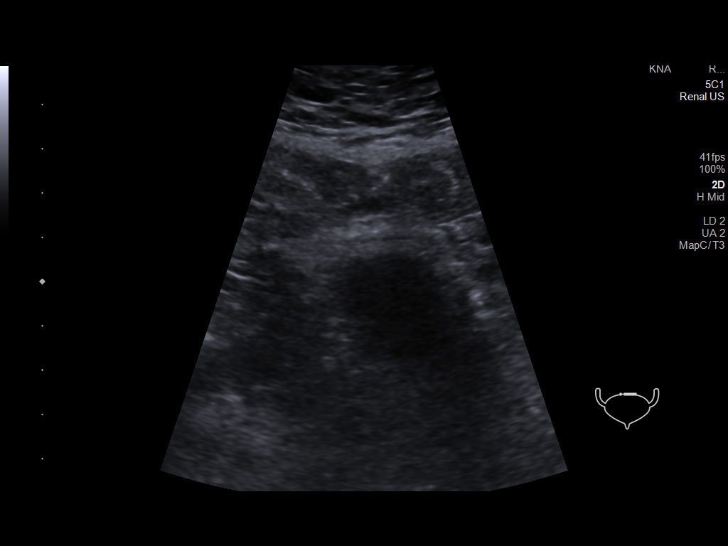
[im 54/54]
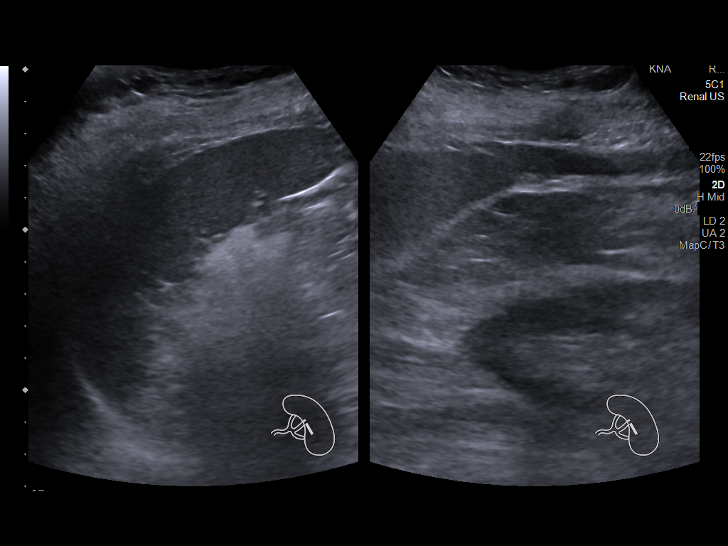

[14 of 25 positions shown; findings below may reference images not displayed]

FINDINGS: Right Kidney:

Renal measurements: 9.0 x 5.6 x 4.4 cm = volume: 115 mL.
Echogenicity within normal limits. No mass or hydronephrosis
visualized.

Left Kidney:

Renal measurements: 11.0 x 5.6 x 4.9 cm = volume: 158 mL.
Echogenicity within normal limits. No mass or hydronephrosis
visualized.

Bladder:

Not well distended and therefore not well visualized. Patient
reportedly voided prior to exam.

Other:

None.
IMPRESSION: No renal abnormality is noted.

## 2023-02-12 DIAGNOSIS — H43393 Other vitreous opacities, bilateral: Secondary | ICD-10-CM | POA: Diagnosis not present

## 2023-04-02 DIAGNOSIS — F419 Anxiety disorder, unspecified: Secondary | ICD-10-CM | POA: Diagnosis not present

## 2023-04-02 DIAGNOSIS — G3184 Mild cognitive impairment, so stated: Secondary | ICD-10-CM | POA: Diagnosis not present

## 2023-04-02 DIAGNOSIS — F332 Major depressive disorder, recurrent severe without psychotic features: Secondary | ICD-10-CM | POA: Diagnosis not present

## 2023-04-16 ENCOUNTER — Ambulatory Visit: Payer: Medicare PPO | Admitting: Physician Assistant

## 2023-04-16 ENCOUNTER — Ambulatory Visit: Payer: Medicare PPO | Admitting: Neurology

## 2023-04-16 ENCOUNTER — Encounter: Payer: Self-pay | Admitting: Physician Assistant

## 2023-04-16 VITALS — BP 130/78 | HR 83 | Ht 68.0 in | Wt 180.0 lb

## 2023-04-16 DIAGNOSIS — R519 Headache, unspecified: Secondary | ICD-10-CM | POA: Diagnosis not present

## 2023-04-16 DIAGNOSIS — F0390 Unspecified dementia without behavioral disturbance: Secondary | ICD-10-CM | POA: Diagnosis not present

## 2023-04-16 NOTE — Patient Instructions (Signed)
Good to see you.  Continue Gabapentin 100mg : take 1 capsule in AM, 2 capsules in PM  2. Continue Donepezil 10mg  daily and Memantine 10mg  twice a day  3 Recommend rechecking with ENT  4. Continue with regular exercise, increasing other activities aside from watching TV     FALL PRECAUTIONS: Be cautious when walking. Scan the area for obstacles that may increase the risk of trips and falls. When getting up in the mornings, sit up at the edge of the bed for a few minutes before getting out of bed. Consider elevating the bed at the head end to avoid drop of blood pressure when getting up. Walk always in a well-lit room (use night lights in the walls). Avoid area rugs or power cords from appliances in the middle of the walkways. Use a walker or a cane if necessary and consider physical therapy for balance exercise. Get your eyesight checked regularly.  HOME SAFETY: Consider the safety of the kitchen when operating appliances like stoves, microwave oven, and blender. Consider having supervision and share cooking responsibilities until no longer able to participate in those. Accidents with firearms and other hazards in the house should be identified and addressed as well.  ABILITY TO BE LEFT ALONE: If patient is unable to contact 911 operator, consider using LifeLine, or when the need is there, arrange for someone to stay with patients. Smoking is a fire hazard, consider supervision or cessation. Risk of wandering should be assessed by caregiver and if detected at any point, supervision and safe proof recommendations should be instituted.   RECOMMENDATIONS FOR ALL PATIENTS WITH MEMORY PROBLEMS: 1. Continue to exercise (Recommend 30 minutes of walking everyday, or 3 hours every week) 2. Increase social interactions - continue going to [place] and enjoy social gatherings with friends and family 3. Eat healthy, avoid fried foods and eat more fruits and vegetables 4. Maintain adequate blood pressure,  blood sugar, and blood cholesterol level. Reducing the risk of stroke and cardiovascular disease also helps promoting better memory. 5. Avoid stressful situations. Live a simple life and avoid aggravations. Organize your time and prepare for the next day in anticipation. 6. Sleep well, avoid any interruptions of sleep and avoid any distractions in the bedroom that may interfere with adequate sleep quality 7. Avoid sugar, avoid sweets as there is a strong link between excessive sugar intake, diabetes, and cognitive impairment The Mediterranean diet has been shown to help patients reduce the risk of progressive memory disorders and reduces cardiovascular risk. This includes eating fish, eat fruits and green leafy vegetables, nuts like almonds and hazelnuts, walnuts, and also use olive oil. Avoid fast foods and fried foods as much as possible. Avoid sweets and sugar as sugar use has been linked to worsening of memory function.  There is always a concern of gradual progression of memory problems. If this is the case, then we may need to adjust level of care according to patient needs. Support, both to the patient and caregiver, should then be put into place.

## 2023-04-16 NOTE — Progress Notes (Signed)
Assessment/Plan:   Dementia likely multifactorial, including vascular and Alzheimer's disease   Chris [name]. is a very pleasant 77 y.o. RH male with a history of diet-controlled hypertension, depression, anxiety and a diagnosis of dementia likely multifactorial, vascular and Alzheimer's disease seen today in follow up for memory loss. Patient is currently on donepezil 10 mg daily and memantine 10 mg twice a day . Mood is controlled by psychiatry. MMSE today is 21/30, with mild cognitive decline noted, although a component of hearing loss could very well contribute to his performance. Patient is able to participate on his ADLs , no longer drives.     Follow up in 6  months, wife requests alternating visits with Dr. Karel Jarvis.  Continue donepezil 10 mg daily and memantine 10 mg twice daily.  Side effects discussed  Recommend good control of her cardiovascular risk factors Continue to control mood as per psychiatry. Continue gabapentin  100 mg in the morning, 200 mg in the afternoon for headaches     Subjective:    This patient is accompanied in the office by his wife  who supplements the history.  Previous records as well as any outside records available were reviewed prior to todays visit. Patient was last seen on June 2024.  Last MMSE December 2023 was 25/30.   Any changes in memory since last visit? " Not very good, a little worse"-wife says. He has more difficulty comprehending than before, including everyday words. "May be also due to his hearing, only uses one". He cannot remember relative's names.  During the day he watches TV, did not do any brain games. repeats oneself?  Endorsed Disoriented when walking into a room?  Patient denies    Leaving objects?  May misplace things but not in unusual places   Wandering behavior?  denies   Any personality changes since last visit?  Denies.   Any worsening depression?:  He has days in which he feels down, more depressed. No changes  from prior visit.  He is under the care of psychiatry. Hallucinations or paranoia?  Denies.   Seizures? denies    Any sleep changes?  Sleeps well, "a lot". He naps during the day, denies vivid dreams, REM behavior or sleepwalking   Sleep apnea?   Denies.   Any hygiene concerns? Denies.  Independent of bathing and dressing?  Endorsed  Does the patient needs help with medications?  Wife is in charge   Who is in charge of the finances? Wife  is in charge     Any changes in appetite?  denies.  He admits to not drinking enough water and only drinks sodas such as Dr. Reino Kent.    Patient have trouble swallowing? Denies.   Does the patient cook? No, wife prepares them. Any headaches?   He has intermittent headaches per wife's report, likely related to sinus disease.  He takes gabapentin twice a day with good relief.  Headaches are not as frequent as before.  Chronic back pain  denies   Ambulates with difficulty? Denies.  He goes for a walk with his wife daily, weather permitting.  Recent falls or head injuries? denies     Unilateral weakness, numbness or tingling? denies   Any tremors?  Mild left greater than right without other parkinsonian symptoms Any anosmia?  Denies   Any incontinence of urine?  Denies Any bowel dysfunction?   Denies      Patient lives with his wife  Does the patient drive? No  longer drives       History on Initial Assessment 05/30/2018: This is a 77 year old right-handed man with a history of diet-controlled hypertension, depression, anxiety, presenting for evaluation of worsening memory. He states his memory is good and bad at certain things, she cannot recall what he did yesterday, he would not recall the date and have to look at his calendar. He denies getting lost driving. His wife occasionally reminds him to take his medications. He has frequent headaches and wants to take over the counter medication all day long. His wife manages bills. His wife states he has never been  one to remember names, but in the past 6 months, she has noticed he would have difficulty thinking of the name of an object and get dates and times confused. He would ask her 5 times in one day what time his doctor appointment was. She denies any significant driving concerns. She has noticed that he gets really nervous and shaky around 2pm, then calms down by 8pm. Sleep is good, no wandering behavior. He still feels drowsy during the day. No paranoia or hallucinations. They report he had failed several grades in school, joined the [employer], then went to [employer] where he had a GPA of 3.85. It was in college where he was formally diagnosed with dyslexia. 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 There is no family history of dementia. No history of significant head injuries. He drinks 1 big can of beer a week, sometimes he drinks 3-4 times a week.    He has had daily headaches for several years and saw neurologist Dr. Neale Burly in 2018. He had trigger point injections and became headache-free for a while, until he had a fall with loss of consciousness last September 2019 and headaches recurred. He has a moderate ache in the frontal region constantly, no nausea/vomiting, photo/phonophobia. He has been taking Tylenol and BC powders but states he does not take it quite everyday. He has dizziness with lightheadedness sometimes lasting all day until he takes meclizine which helps. He denies any diplopia, dysarthria/dysphagia, back pain, bladder dysfunction, anosmia. He has occasional neck pain and constipation. He started noticing bilateral hand tremors 2-3 years ago which do not affect writing or using utensils. He fell while walking on a trail last September, he recalls starting feeling bad in his stomach and started walking faster, then passed out. He feels he was only out for a second, he does not remember hitting the ground but stood up and realized he had fallen down. He had significant  injury on the left eyelid and left side of his face and underwent plastic surgery. He still has stitches on the left lateral eyelid. He has occasional numbness and tingling in his right hand, it feels cold sometimes. He has noticed difficulty buttoning or tying shoelaces with his right hand. His wife has noticed shuffling when walking over the past year.   I personally reviewed MRI brain without contrast done 11/27/2016 which did not show any acute changes. There was scattered foci of susceptibility are present over the posterior left temporal lobe and occipital lobe, increased dural vasculature is suggested on axial image 8 of series 7. 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 There was moderate diffuse atrophy and mild chronic microvascular disease   I personally reviewed MRI brain with and without contrast done February 2020 which did not show any acute changes.There was stable small chronic infarct within the right lateral temporal  lobe, stable mild chronic microvascular disease, moderate diffuse volume loss with prominent volume loss in the anteromedial temporal lobes and parahippocampal gyri (which can be seen with Alzheimer's or frontotemporal lobar degeneration). There were numerous foci of chronic microhemorrhages predominantly in the posterior distribution, greater on the left (favoring amyloid angiopathy).    Neuropsychological testing in 02/2019 showed Major Neurocognitive Disorder (dementia), mild end of spectrum. Etiology likely multifactorial, vascular and Alzheimer's disease.    PREVIOUS MEDICATIONS:   CURRENT MEDICATIONS:  Outpatient Encounter Medications as of 04/16/2023  Medication Sig   acetaminophen (TYLENOL) 325 MG tablet Take 650 mg by mouth every 6 (six) hours as needed.   buPROPion (WELLBUTRIN XL) 150 MG 24 hr tablet    busPIRone (BUSPAR) 15 MG tablet Take 15 mg by mouth 3 (three) times daily.    desvenlafaxine (PRISTIQ) 100 MG 24 hr tablet    donepezil  (ARICEPT) 10 MG tablet Take 1 tablet daily   fluticasone (FLONASE) 50 MCG/ACT nasal spray    gabapentin (NEURONTIN) 100 MG capsule Take 1 capsule in AM, 2 capsules in PM   ibuprofen (ADVIL) 200 MG tablet Take 200 mg by mouth every 6 (six) hours as needed.   losartan (COZAAR) 25 MG tablet Take 25 mg by mouth daily.   Meclizine HCl 25 MG CHEW Chew by mouth.   memantine (NAMENDA) 10 MG tablet Take 1 tablet twice a day   montelukast (SINGULAIR) 10 MG tablet    Multiple Vitamins-Minerals (MULTIVITAL) tablet Take 1 tablet by mouth daily.   Omega-3 Fatty Acids (FISH OIL) 1000 MG CAPS Take by mouth.   Phenylephrine-DM-GG-APAP (MUCINEX SINUS-MAX) 5-10-200-325 MG TABS Take by mouth as needed.   silodosin (RAPAFLO) 8 MG CAPS capsule Take 1 capsule (8 mg total) by mouth daily with breakfast.   vitamin B-12 (CYANOCOBALAMIN) 100 MCG tablet Take 100 mcg by mouth daily.   Melatonin 10 MG CAPS Take 20 mg by mouth at bedtime. (Patient not taking: Reported on 04/16/2023)   No facility-administered encounter medications on file as of 04/16/2023.       04/16/2023   12:00 PM 03/23/2022   11:00 AM 09/19/2021    8:00 AM  MMSE - Mini Mental State Exam  Orientation to time 2 4 4   Orientation to Place 4 5 5   Registration 3 3 3   Attention/ Calculation 4 4 5   Recall 0 1 0  Language- name 2 objects 2 2 2   Language- repeat 1 1 1   Language- follow 3 step command 3 3 3   Language- read & follow direction 1 1 1   Write a sentence 0 0 1  Copy design 1 1 1   Total score 21 25 26       12/16/2018   10:00 AM 05/30/2018    9:00 AM  Montreal Cognitive Assessment   Visuospatial/ Executive (0/5) 3 5  Naming (0/3) 2 1  Attention: Read list of digits (0/2) 2 1  Attention: Read list of letters (0/1) 1 1  Attention: Serial 7 subtraction starting at 100 (0/3) 1 2  Language: Repeat phrase (0/2) 0 0  Language : Fluency (0/1) 0 1  Abstraction (0/2) 0 0  Delayed Recall (0/5) 0 0  Orientation (0/6) 2 4  Total 11 15     Objective:     PHYSICAL EXAMINATION:    VITALS:   Vitals:   04/16/23 1046 04/16/23 1152  BP: (!) 151/81 130/78  Pulse: 83   SpO2: 95%   Weight: 180 lb (81.6 kg)   Height:  5\' 8"  (1.727 m)     GEN:  The patient appears stated age and is in NAD. HEENT:  Normocephalic, atraumatic.   Neurological examination:  General: NAD, well-groomed, appears stated age. Orientation: The patient is alert. Oriented to person, place and not to date Cranial nerves: There is good facial symmetry. Flat affect.The speech is fluent and clear. No aphasia or dysarthria. Fund of knowledge is appropriate. Recent and remote memory are impaired. Attention and concentration are reduced.  Able to name objects and repeat phrases.  Hearing is decreased to conversational tone, uses one hearing aid.   Sensation: Sensation is intact to light touch throughout Motor: Strength is at least antigravity x4. DTR's 2/4 in UE/LE     Movement examination: Tone: There is normal tone in the UE/LE Abnormal movements: mild postural tremor, no resting tremor.  No myoclonus.  No asterixis.   Coordination:  There is no decremation with RAM's. Normal finger to nose  Gait and Station: The patient has no difficulty arising out of a deep-seated chair without the use of the hands. The patient's stride length is good.  Gait is cautious and narrow.    Thank you for allowing [place] the opportunity to participate in the care of this nice patient. Please do not hesitate to contact us for any questions or concerns.   Total time spent on today's visit was 36 minutes dedicated to this patient today, preparing to see patient, examining the patient, ordering tests and/or medications and counseling the patient, documenting clinical information in the EHR or other health record, independently interpreting results and communicating results to the patient/family, discussing treatment and goals, answering patient's questions and coordinating  care.  Cc:  Lianne Moris, PA-C  Marlowe Kays 04/16/2023 12:15 PM

## 2023-06-01 ENCOUNTER — Telehealth: Payer: Self-pay | Admitting: Neurology

## 2023-06-01 DIAGNOSIS — F0390 Unspecified dementia without behavioral disturbance: Secondary | ICD-10-CM

## 2023-06-01 DIAGNOSIS — R519 Headache, unspecified: Secondary | ICD-10-CM

## 2023-06-01 MED ORDER — MEMANTINE HCL 10 MG PO TABS
ORAL_TABLET | ORAL | 3 refills | Status: DC
Start: 2023-06-01 — End: 2023-07-16

## 2023-06-01 MED ORDER — GABAPENTIN 100 MG PO CAPS
ORAL_CAPSULE | ORAL | 3 refills | Status: DC
Start: 2023-06-01 — End: 2023-11-12

## 2023-06-01 MED ORDER — DONEPEZIL HCL 10 MG PO TABS
ORAL_TABLET | ORAL | 3 refills | Status: DC
Start: 2023-06-01 — End: 2023-07-16

## 2023-06-01 NOTE — Telephone Encounter (Signed)
Pt's wife called back in. The pt needs memantine, donepezil, and gabapentin sent to [facility]. They are switching pharmacies.

## 2023-06-01 NOTE — Telephone Encounter (Signed)
Refill sent in for pt.

## 2023-06-01 NOTE — Telephone Encounter (Signed)
Pt wife called to change Pharmacy, but did not provide what pharmacy. No answer on call back, LMOM

## 2023-06-26 DIAGNOSIS — Z6827 Body mass index (BMI) 27.0-27.9, adult: Secondary | ICD-10-CM | POA: Diagnosis not present

## 2023-06-26 DIAGNOSIS — J40 Bronchitis, not specified as acute or chronic: Secondary | ICD-10-CM | POA: Diagnosis not present

## 2023-06-26 DIAGNOSIS — Z20828 Contact with and (suspected) exposure to other viral communicable diseases: Secondary | ICD-10-CM | POA: Diagnosis not present

## 2023-06-26 DIAGNOSIS — R051 Acute cough: Secondary | ICD-10-CM | POA: Diagnosis not present

## 2023-07-03 ENCOUNTER — Other Ambulatory Visit: Payer: Medicare PPO

## 2023-07-03 DIAGNOSIS — R972 Elevated prostate specific antigen [PSA]: Secondary | ICD-10-CM | POA: Diagnosis not present

## 2023-07-04 LAB — PSA: Prostate Specific Ag, Serum: 6.8 ng/mL — ABNORMAL HIGH (ref 0.0–4.0)

## 2023-07-09 ENCOUNTER — Ambulatory Visit: Payer: Medicare PPO | Admitting: Urology

## 2023-07-09 VITALS — BP 119/79 | HR 94

## 2023-07-09 DIAGNOSIS — R351 Nocturia: Secondary | ICD-10-CM | POA: Diagnosis not present

## 2023-07-09 DIAGNOSIS — R972 Elevated prostate specific antigen [PSA]: Secondary | ICD-10-CM

## 2023-07-09 DIAGNOSIS — N401 Enlarged prostate with lower urinary tract symptoms: Secondary | ICD-10-CM | POA: Diagnosis not present

## 2023-07-09 DIAGNOSIS — N138 Other obstructive and reflux uropathy: Secondary | ICD-10-CM

## 2023-07-09 LAB — URINALYSIS, ROUTINE W REFLEX MICROSCOPIC
Bilirubin, UA: NEGATIVE
Glucose, UA: NEGATIVE
Ketones, UA: NEGATIVE
Leukocytes,UA: NEGATIVE
Nitrite, UA: NEGATIVE
Protein,UA: NEGATIVE
Specific Gravity, UA: 1.03 (ref 1.005–1.030)
Urobilinogen, Ur: 0.2 mg/dL (ref 0.2–1.0)
pH, UA: 5.5 (ref 5.0–7.5)

## 2023-07-09 LAB — MICROSCOPIC EXAMINATION
Bacteria, UA: NONE SEEN
Epithelial Cells (non renal): NONE SEEN /HPF (ref 0–10)
WBC, UA: NONE SEEN /HPF (ref 0–5)

## 2023-07-09 MED ORDER — SILODOSIN 8 MG PO CAPS: 8.0000 mg | ORAL_CAPSULE | Freq: Every day | ORAL | 3 refills | Status: DC

## 2023-07-09 NOTE — Progress Notes (Unsigned)
 07/09/2023 10:17 AM   Brance Titus Dubin. Nov 27, 1945 [id number]  Referring provider: Lianne Moris, PA-C [address] [place],  [place] [address]  Followup elevated PSA   HPI: [name] is a 78yo here for followup for elevated PSA and BPH. PSa increased to 6.8 from 5.1. He has nocturia 3-4x. He drinks 2 diet Dr peppers at night. Urine stream strong. No straining to urinate. No other complaints today    PMH: Past Medical History:  Diagnosis Date   Anxiety    Chest pain, unspecified    Depression    Dyslexia    Hearing loss    Attributed to recurring sinus infections   Seasonal allergies    Stroke (HCC)    Right lateral temporal lobe   Unspecified essential hypertension     Surgical History: Past Surgical History:  Procedure Laterality Date   HIP ARTHROPLASTY     TOTAL KNEE ARTHROPLASTY      Home Medications:  Allergies as of 07/09/2023   No Known Allergies      Medication List        Accurate as of July 09, 2023 10:17 AM. If you have any questions, ask your nurse or doctor.          acetaminophen 325 MG tablet Commonly known as: TYLENOL Take 650 mg by mouth every 6 (six) hours as needed.   buPROPion 150 MG 24 hr tablet Commonly known as: WELLBUTRIN XL   busPIRone 15 MG tablet Commonly known as: BUSPAR Take 15 mg by mouth 3 (three) times daily.   desvenlafaxine 100 MG 24 hr tablet Commonly known as: PRISTIQ   donepezil 10 MG tablet Commonly known as: ARICEPT Take 1 tablet daily   Fish Oil 1000 MG Caps Take by mouth.   fluticasone 50 MCG/ACT nasal spray Commonly known as: FLONASE   gabapentin 100 MG capsule Commonly known as: Neurontin Take 1 capsule in AM, 2 capsules in PM   ibuprofen 200 MG tablet Commonly known as: ADVIL Take 200 mg by mouth every 6 (six) hours as needed.   losartan 25 MG tablet Commonly known as: COZAAR Take 25 mg by mouth daily.   Meclizine HCl 25 MG Chew Chew by mouth.   Melatonin 10 MG Caps Take 20 mg by  mouth at bedtime.   memantine 10 MG tablet Commonly known as: Namenda Take 1 tablet twice a day   montelukast 10 MG tablet Commonly known as: SINGULAIR   Mucinex Sinus-Max 5-10-200-325 MG Tabs Generic drug: Phenylephrine-DM-GG-APAP Take by mouth as needed.   Multivital tablet Take 1 tablet by mouth daily.   silodosin 8 MG Caps capsule Commonly known as: RAPAFLO Take 1 capsule (8 mg total) by mouth daily with breakfast.   vitamin B-12 100 MCG tablet Commonly known as: CYANOCOBALAMIN Take 100 mcg by mouth daily.        Allergies: No Known Allergies  Family History: Family History  Problem Relation Age of Onset   Heart disease Mother    Cancer Father     Social History:  reports that he quit smoking about 53 years ago. He started smoking about 55 years ago. He has a 1 pack-year smoking history. He has never used smokeless tobacco. He reports current alcohol use. He reports that he does not use drugs.  ROS: All other review of systems were reviewed and are negative except what is noted above in HPI  Physical Exam: BP 119/79   Pulse 94   Constitutional:  Alert  and oriented, No acute distress. HEENT:  AT, moist mucus membranes.  Trachea midline, no masses. Cardiovascular: No clubbing, cyanosis, or edema. Respiratory: Normal respiratory effort, no increased work of breathing. GI: Abdomen is soft, nontender, nondistended, no abdominal masses GU: No CVA tenderness.  Lymph: No cervical or inguinal lymphadenopathy. Skin: No rashes, bruises or suspicious lesions. Neurologic: Grossly intact, no focal deficits, moving all 4 extremities. Psychiatric: Normal mood and affect.  Laboratory Data: Lab Results  Component Value Date   WBC 5.7 01/18/2008   HGB 13.8 01/18/2008   HCT 39.8 01/18/2008   MCV 92.6 01/18/2008   PLT 159 01/18/2008    Lab Results  Component Value Date   CREATININE 0.88 01/18/2008    No results found for: "PSA"  No results found for:  "TESTOSTERONE"  No results found for: "HGBA1C"  Urinalysis    Component Value Date/Time   APPEARANCEUR Clear 07/10/2022 0930   GLUCOSEU Negative 07/10/2022 0930   BILIRUBINUR Negative 07/10/2022 0930   PROTEINUR 1+ (A) 07/10/2022 0930   UROBILINOGEN 0.2 08/04/2019 1340   NITRITE Negative 07/10/2022 0930   LEUKOCYTESUR Negative 07/10/2022 0930    Lab Results  Component Value Date   LABMICR See below: 07/10/2022   WBCUA 0-5 07/10/2022   LABEPIT 0-10 07/10/2022   MUCUS Present 02/04/2020   BACTERIA None seen 07/10/2022    Pertinent Imaging:  No results found for this or any previous visit.  No results found for this or any previous visit.  No results found for this or any previous visit.  No results found for this or any previous visit.  Results for orders placed during the hospital encounter of 01/04/21  Ultrasound renal complete  Narrative CLINICAL DATA:  Nephrolithiasis.  EXAM: RENAL / URINARY TRACT ULTRASOUND COMPLETE  COMPARISON:  January 22, 2020.  FINDINGS: Right Kidney:  Renal measurements: 9.0 x 5.6 x 4.4 cm = volume: 115 mL. Echogenicity within normal limits. No mass or hydronephrosis visualized.  Left Kidney:  Renal measurements: 11.0 x 5.6 x 4.9 cm = volume: 158 mL. Echogenicity within normal limits. No mass or hydronephrosis visualized.  Bladder:  Not well distended and therefore not well visualized. Patient reportedly voided prior to exam.  Other:  None.  IMPRESSION: No renal abnormality is noted.   Electronically Signed By: Lupita Raider M.D. On: 01/05/2021 09:59  No results found for this or any previous visit.  No results found for this or any previous visit.  No results found for this or any previous visit.   Assessment & Plan:    1. Elevated PSA (Primary) Followup 3 months with PSA - Urinalysis, Routine w reflex microscopic  2. Benign prostatic hyperplasia with urinary obstruction Rapaflo 8mg  daily  3.  Nocturia Rapaflo 8mg  daily   No follow-ups on file.  Wilkie Aye, MD  [facility] [place]

## 2023-07-13 ENCOUNTER — Encounter: Payer: Self-pay | Admitting: Urology

## 2023-07-13 NOTE — Patient Instructions (Signed)

## 2023-07-14 ENCOUNTER — Other Ambulatory Visit: Payer: Self-pay | Admitting: Neurology

## 2023-07-14 DIAGNOSIS — F0390 Unspecified dementia without behavioral disturbance: Secondary | ICD-10-CM

## 2023-07-30 ENCOUNTER — Telehealth: Payer: Self-pay

## 2023-07-30 DIAGNOSIS — R972 Elevated prostate specific antigen [PSA]: Secondary | ICD-10-CM

## 2023-07-30 MED ORDER — LEVOFLOXACIN 750 MG PO TABS: 750.0000 mg | ORAL_TABLET | Freq: Every day | ORAL | 0 refills | Status: DC

## 2023-07-30 NOTE — Telephone Encounter (Signed)
-----   Message from Chris [name] sent at 07/26/2023  4:13 PM EDT ----- H eneeds to proceed with prostatew biopsy ----- Message ----- From: Roselee Cong, LPN Sent: 12/21/452   3:08 PM EDT To: Marco Severs, MD  Positive ISO PSA

## 2023-07-30 NOTE — Telephone Encounter (Signed)
 Wife made aware of patients elevated Iso Psa test and Dr. Mozell Arias recommendations for prostate biopsy. Prostate biopsy instructions went over with patient's wife via phone and sent via mail.  Orders placed and Biopsy scheduled.

## 2023-08-20 ENCOUNTER — Other Ambulatory Visit: Payer: Self-pay | Admitting: Urology

## 2023-08-20 DIAGNOSIS — H35371 Puckering of macula, right eye: Secondary | ICD-10-CM | POA: Diagnosis not present

## 2023-08-20 MED ORDER — LEVOFLOXACIN 750 MG PO TABS
750.0000 mg | ORAL_TABLET | Freq: Every day | ORAL | 0 refills | Status: AC
Start: 2023-08-20 — End: ?

## 2023-08-20 NOTE — Telephone Encounter (Signed)
 Pt took Rx on the wrong date

## 2023-09-12 ENCOUNTER — Ambulatory Visit (HOSPITAL_COMMUNITY)
Admission: RE | Admit: 2023-09-12 | Discharge: 2023-09-12 | Disposition: A | Discharge status: Home / Self Care | Source: Ambulatory Visit | Attending: Urology | Admitting: Urology

## 2023-09-12 ENCOUNTER — Encounter: Payer: Self-pay | Admitting: Urology

## 2023-09-12 ENCOUNTER — Encounter (HOSPITAL_COMMUNITY): Payer: Self-pay

## 2023-09-12 ENCOUNTER — Other Ambulatory Visit: Payer: Self-pay | Admitting: Urology

## 2023-09-12 ENCOUNTER — Ambulatory Visit: Admitting: Urology

## 2023-09-12 VITALS — BP 119/84 | HR 79 | Temp 98.0°F | Resp 16

## 2023-09-12 DIAGNOSIS — R972 Elevated prostate specific antigen [PSA]: Secondary | ICD-10-CM | POA: Diagnosis not present

## 2023-09-12 DIAGNOSIS — D291 Benign neoplasm of prostate: Secondary | ICD-10-CM | POA: Diagnosis not present

## 2023-09-12 MED ORDER — LIDOCAINE HCL (PF) 2 % IJ SOLN
10.0000 mL | Freq: Once | INTRAMUSCULAR | Status: AC
  Administered 2023-09-12: 10 mL

## 2023-09-12 MED ORDER — LIDOCAINE HCL (PF) 2 % IJ SOLN
INTRAMUSCULAR | Status: AC
  Filled 2023-09-12: qty 10

## 2023-09-12 MED ORDER — GENTAMICIN SULFATE 40 MG/ML IJ SOLN
INTRAMUSCULAR | Status: AC
  Filled 2023-09-12: qty 2

## 2023-09-12 MED ORDER — GENTAMICIN SULFATE 40 MG/ML IJ SOLN
80.0000 mg | Freq: Once | INTRAMUSCULAR | Status: AC
  Administered 2023-09-12: 80 mg via INTRAMUSCULAR

## 2023-09-12 NOTE — Progress Notes (Signed)
 Prostate Biopsy Procedure   Informed consent was obtained after discussing risks/benefits of the procedure.  A time out was performed to ensure correct patient identity.  Pre-Procedure: - Last PSA Level: No results found for: "PSA" - Gentamicin given prophylactically - Levaquin  500 mg administered PO -Transrectal Ultrasound performed revealing a 89.3 gm prostate -No significant hypoechoic or median lobe noted  Procedure: - Prostate block performed using 10 cc 1% lidocaine and biopsies taken from sextant areas, a total of 12 under ultrasound guidance.  Post-Procedure: - Patient tolerated the procedure well - He was counseled to seek immediate medical attention if experiences any severe pain, significant bleeding, or fevers - Return in one week to discuss biopsy results

## 2023-09-12 NOTE — Progress Notes (Signed)
 PT tolerated prostate biopsy procedure and antibiotic injection well today. Labs obtained and sent for pathology. PT ambulatory at discharge with no acute distress noted and verbalized understanding of discharge instructions. PT to follow up with urologist as scheduled on 09/24/23 @4 :00pm.

## 2023-09-12 NOTE — Patient Instructions (Signed)
 Transrectal Ultrasound-Guided Prostate Biopsy, Care After What can I expect after the procedure? After the procedure, it is common to have: Pain and discomfort near your butt (rectum), especially while sitting. Pink-colored pee (urine). This is due to small amounts of blood in your pee. A burning feeling while peeing. Blood in your poop (stool). Bleeding from your butt. Blood in your semen. Follow these instructions at home: Medicines Take over-the-counter and prescription medicines only as told by your doctor. If you were given a sedative during your procedure, do not drive or use machines until your doctor says that it is safe. A sedative is a medicine that helps you relax. If you were prescribed an antibiotic medicine, take it as told by your doctor. Do not stop taking it even if you start to feel better. Activity  Return to your normal activities when your doctor says that it is safe. Ask your doctor when it is okay for you to have sex. You may have to avoid lifting. Ask your doctor how much you can safely lift. General instructions  Drink enough water to keep your pee pale yellow. Watch your pee, poop, and semen for new bleeding or bleeding that gets worse. Keep all follow-up visits. Contact a doctor if: You have any of these: Blood clots in your pee or poop. Blood in your pee more than 2 weeks after the procedure. Blood in your semen more than 2 months after the procedure. New or worse bleeding in your pee, poop, or semen. Very bad belly pain. Your pee smells bad or unusual. You have trouble peeing. Your lower belly feels firm. You have problems getting an erection. You feel like you may vomit (are nauseous), or you vomit. Get help right away if: You have a fever or chills. You have bright red pee. You have very bad pain that does not get better with medicine. You cannot pee. Summary After this procedure, it is common to have pain and discomfort near your butt,  especially while sitting. You may have blood in your pee and poop. It is common to have blood in your semen. Get help right away if you have a fever or chills. This information is not intended to replace advice given to you by your health care provider. Make sure you discuss any questions you have with your health care provider. Document Revised: 09/27/2020 Document Reviewed: 09/27/2020 Elsevier Patient Education  2024 [employer].

## 2023-09-13 LAB — SURGICAL PATHOLOGY

## 2023-09-18 ENCOUNTER — Ambulatory Visit: Payer: Self-pay

## 2023-09-18 NOTE — Telephone Encounter (Signed)
 Wife called and made aware of negative prostate biopsy. Biopsy talk canceled and rescheduled for 6 month with psa prior.

## 2023-09-18 NOTE — Telephone Encounter (Signed)
-----   Message from Chris [name] sent at 09/18/2023  8:26 AM EDT ----- Biopsy negative ----- Message ----- From: Interface, Lab In [facility] Sent: 09/13/2023  11:32 AM EDT To: Marco Severs, MD

## 2023-09-24 ENCOUNTER — Ambulatory Visit: Admitting: Urology

## 2023-10-04 ENCOUNTER — Other Ambulatory Visit

## 2023-10-12 ENCOUNTER — Ambulatory Visit: Admitting: Urology

## 2023-10-29 DIAGNOSIS — G3184 Mild cognitive impairment, so stated: Secondary | ICD-10-CM | POA: Diagnosis not present

## 2023-10-29 DIAGNOSIS — F419 Anxiety disorder, unspecified: Secondary | ICD-10-CM | POA: Diagnosis not present

## 2023-10-29 DIAGNOSIS — F332 Major depressive disorder, recurrent severe without psychotic features: Secondary | ICD-10-CM | POA: Diagnosis not present

## 2023-11-12 ENCOUNTER — Encounter: Payer: Self-pay | Admitting: Neurology

## 2023-11-12 ENCOUNTER — Ambulatory Visit: Payer: Medicare PPO | Admitting: Neurology

## 2023-11-12 VITALS — BP 103/72 | HR 90 | Ht 68.0 in | Wt 175.0 lb

## 2023-11-12 DIAGNOSIS — F0390 Unspecified dementia without behavioral disturbance: Secondary | ICD-10-CM

## 2023-11-12 DIAGNOSIS — R519 Headache, unspecified: Secondary | ICD-10-CM

## 2023-11-12 MED ORDER — MEMANTINE HCL 10 MG PO TABS: 10.0000 mg | ORAL_TABLET | Freq: Two times a day (BID) | ORAL | 3 refills | Status: AC

## 2023-11-12 MED ORDER — GABAPENTIN 100 MG PO CAPS: ORAL_CAPSULE | ORAL | 3 refills | Status: AC

## 2023-11-12 MED ORDER — DONEPEZIL HCL 10 MG PO TABS: 10.0000 mg | ORAL_TABLET | Freq: Every day | ORAL | 3 refills | Status: AC

## 2023-11-12 NOTE — Patient Instructions (Signed)
 Good to see you.  Let's try reducing the Gabapentin  as tolerated: take 1 capsule twice a day. If headaches worsen, go back to 1 cap in AM, 2 caps in PM  2. Continue Donepezil  10mg  daily and Memantine  10mg  twice a day  3. Continue to monitor hearing  4. Continue close supervision, follow-up with Camie in 6 months, then with me in a year   FALL PRECAUTIONS: Be cautious when walking. Scan the area for obstacles that may increase the risk of trips and falls. When getting up in the mornings, sit up at the edge of the bed for a few minutes before getting out of bed. Consider elevating the bed at the head end to avoid drop of blood pressure when getting up. Walk always in a well-lit room (use night lights in the walls). Avoid area rugs or power cords from appliances in the middle of the walkways. Use a walker or a cane if necessary and consider physical therapy for balance exercise. Get your eyesight checked regularly.  HOME SAFETY: Consider the safety of the kitchen when operating appliances like stoves, microwave oven, and blender. Consider having supervision and share cooking responsibilities until no longer able to participate in those. Accidents with firearms and other hazards in the house should be identified and addressed as well.  ABILITY TO BE LEFT ALONE: If patient is unable to contact 911 operator, consider using LifeLine, or when the need is there, arrange for someone to stay with patients. Smoking is a fire hazard, consider supervision or cessation. Risk of wandering should be assessed by caregiver and if detected at any point, supervision and safe proof recommendations should be instituted.   RECOMMENDATIONS FOR ALL PATIENTS WITH MEMORY PROBLEMS: 1. Continue to exercise (Recommend 30 minutes of walking everyday, or 3 hours every week) 2. Increase social interactions - continue going to [place] and enjoy social gatherings with friends and family 3. Eat healthy, avoid fried foods and eat more  fruits and vegetables 4. Maintain adequate blood pressure, blood sugar, and blood cholesterol level. Reducing the risk of stroke and cardiovascular disease also helps promoting better memory. 5. Avoid stressful situations. Live a simple life and avoid aggravations. Organize your time and prepare for the next day in anticipation. 6. Sleep well, avoid any interruptions of sleep and avoid any distractions in the bedroom that may interfere with adequate sleep quality 7. Avoid sugar, avoid sweets as there is a strong link between excessive sugar intake, diabetes, and cognitive impairment The Mediterranean diet has been shown to help patients reduce the risk of progressive memory disorders and reduces cardiovascular risk. This includes eating fish, eat fruits and green leafy vegetables, nuts like almonds and hazelnuts, walnuts, and also use olive oil. Avoid fast foods and fried foods as much as possible. Avoid sweets and sugar as sugar use has been linked to worsening of memory function.  There is always a concern of gradual progression of memory problems. If this is the case, then we may need to adjust level of care according to patient needs. Support, both to the patient and caregiver, should then be put into place.

## 2023-11-12 NOTE — Progress Notes (Signed)
 NEUROLOGY FOLLOW UP OFFICE NOTE  Chris [name] [id number] [birth year]  HISTORY OF PRESENT ILLNESS: I had the pleasure of seeing Chris Cid Agena. in follow-up in the neurology clinic on 11/12/2023.  The patient was last seen by Memory Disorders PA Camie Sevin 7 months ago for mixed dementia. He is again accompanied by his wife who helps supplement the history today.  Records and images were personally reviewed where available.  MMSE 21/30 in 03/2023. He is on Donepezil  10mg  daily and Memantine  10mg  BID without side effects. He feels his memory is pretty good. His wife notes worsening short-term memory, he asks the same questions 3-5 times. She manages medications, finances, meals. He does not drive. He is independent with dressing and bathing. Mood is pretty good, his wife notes he is a little more agitated later in the day, talking a lot but not making sense. No paranoia or hallucinations.  He has chronic daily headaches with good response to Gabapentin  100mg  in AM, 200mg  in PM. He reports headaches are on and off, his wife notes there is a headache daily but he does not complain a lot about them and does not take any prn medication. His dizziness has gotten worse, his wife reports this is a chronic issue for years, he does not do well walking more than 1-2 miles, he would get dizzy/lightheaded and have to sit down. He states the dizziness does not last long, he does not complain of dizziness once sitting. 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 This is the 5th pair. No falls. He is sleeping well.       History on Initial Assessment 05/30/2018: This is a 78 year old right-handed man with a history of diet-controlled hypertension, depression, anxiety, presenting for evaluation of worsening memory. He states his memory is good and bad at certain things, she cannot recall what he did yesterday, he would not recall the date and have to look at his calendar. He denies  getting lost driving. His wife occasionally reminds him to take his medications. He has frequent headaches and wants to take over the counter medication all day long. His wife manages bills. His wife states he has never been one to remember names, but in the past 6 months, she has noticed he would have difficulty thinking of the name of an object and get dates and times confused. He would ask her 5 times in one day what time his doctor appointment was. She denies any significant driving concerns. She has noticed that he gets really nervous and shaky around 2pm, then calms down by 8pm. Sleep is good, no wandering behavior. He still feels drowsy during the day. No paranoia or hallucinations. They report he had failed several grades in school, joined the [employer], then went to [employer] where he had a GPA of 3.85. It was in college where he was formally diagnosed with dyslexia. 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 There is no family history of dementia. No history of significant head injuries. He drinks 1 big can of beer a week, sometimes he drinks 3-4 times a week.    He has had daily headaches for several years and saw neurologist Dr. Oneita in 2018. He had trigger point injections and became headache-free for a while, until he had a fall with loss of consciousness last September 2019 and headaches recurred. He has a moderate ache in the frontal region constantly, no nausea/vomiting,  photo/phonophobia. He has been taking Tylenol and BC powders but states he does not take it quite everyday. He has dizziness with lightheadedness sometimes lasting all day until he takes meclizine which helps. He denies any diplopia, dysarthria/dysphagia, back pain, bladder dysfunction, anosmia. He has occasional neck pain and constipation. He started noticing bilateral hand tremors 2-3 years ago which do not affect writing or using utensils. He fell while walking on a trail last September, he recalls  starting feeling bad in his stomach and started walking faster, then passed out. He feels he was only out for a second, he does not remember hitting the ground but stood up and realized he had fallen down. He had significant injury on the left eyelid and left side of his face and underwent plastic surgery. He still has stitches on the left lateral eyelid. He has occasional numbness and tingling in his right hand, it feels cold sometimes. He has noticed difficulty buttoning or tying shoelaces with his right hand. His wife has noticed shuffling when walking over the past year.   I personally reviewed MRI brain without contrast done 11/27/2016 which did not show any acute changes. There was scattered foci of susceptibility are present over the posterior left temporal lobe and occipital lobe, increased dural vasculature is suggested on axial image 8 of series 7. 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 There was moderate diffuse atrophy and mild chronic microvascular disease   I personally reviewed MRI brain with and without contrast done February 2020 which did not show any acute changes.There was stable small chronic infarct within the right lateral temporal lobe, stable mild chronic microvascular disease, moderate diffuse volume loss with prominent volume loss in the anteromedial temporal lobes and parahippocampal gyri (which can be seen with Alzheimer's or frontotemporal lobar degeneration). There were numerous foci of chronic microhemorrhages predominantly in the posterior distribution, greater on the left (favoring amyloid angiopathy).    Neuropsychological testing in 02/2019 showed Major Neurocognitive Disorder (dementia), mild end of spectrum. Etiology likely multifactorial, vascular and Alzheimer's disease.    PAST MEDICAL HISTORY: Past Medical History:  Diagnosis Date   Anxiety    Chest pain, unspecified    Depression    Dyslexia    Hearing loss    Attributed to recurring sinus  infections   Seasonal allergies    Stroke [facility])    Right lateral temporal lobe   Unspecified essential hypertension     MEDICATIONS: Current Outpatient Medications on File Prior to Visit  Medication Sig Dispense Refill   acetaminophen (TYLENOL) 325 MG tablet Take 650 mg by mouth every 6 (six) hours as needed.     buPROPion (WELLBUTRIN XL) 150 MG 24 hr tablet      busPIRone (BUSPAR) 15 MG tablet Take 15 mg by mouth 3 (three) times daily.      desvenlafaxine (PRISTIQ) 100 MG 24 hr tablet      donepezil  (ARICEPT ) 10 MG tablet TAKE 1 TABLET BY MOUTH DAILY 90 tablet 0   fluticasone (FLONASE) 50 MCG/ACT nasal spray      gabapentin  (NEURONTIN ) 100 MG capsule Take 1 capsule in AM, 2 capsules in PM 270 capsule 3   ibuprofen (ADVIL) 200 MG tablet Take 200 mg by mouth every 6 (six) hours as needed.     silodosin  (RAPAFLO ) 8 MG CAPS capsule Take 1 capsule (8 mg total) by mouth daily with breakfast. 90 capsule 3   levofloxacin  (LEVAQUIN ) 750 MG tablet Take 1 tablet (750 mg total) by  mouth daily. Take 1 hour prior to your prostate biopsy procedure 09/12/23 1 tablet 0   losartan (COZAAR) 25 MG tablet Take 25 mg by mouth daily.     Meclizine HCl 25 MG CHEW Chew by mouth.     Melatonin 10 MG CAPS Take 20 mg by mouth at bedtime. (Patient not taking: Reported on 04/16/2023)     memantine  (NAMENDA ) 10 MG tablet TAKE 1 TABLET BY MOUTH TWICE DAILY 180 tablet 0   montelukast (SINGULAIR) 10 MG tablet      Multiple Vitamins-Minerals (MULTIVITAL) tablet Take 1 tablet by mouth daily.     Omega-3 Fatty Acids (FISH OIL) 1000 MG CAPS Take by mouth.     Phenylephrine-DM-GG-APAP (MUCINEX SINUS-MAX) 5-10-200-325 MG TABS Take by mouth as needed.     vitamin B-12 (CYANOCOBALAMIN ) 100 MCG tablet Take 100 mcg by mouth daily.     No current facility-administered medications on file prior to visit.    ALLERGIES: No Known Allergies  FAMILY HISTORY: Family History  Problem Relation Age of Onset   Heart disease Mother     Cancer Father     SOCIAL HISTORY: Social History   Socioeconomic History   Marital status: Married    Spouse name: Not on file   Number of children: 2   Years of education: 16   Highest education level: Bachelor's degree (e.g., BA, AB, BS)  Occupational History   Not on file  Tobacco Use   Smoking status: Former    Current packs/day: 0.00    Average packs/day: 0.5 packs/day for 2.0 years (1.0 ttl pk-yrs)    Types: Cigarettes    Start date: 08/04/1967    Quit date: 08/03/1969    Years since quitting: 54.3   Smokeless tobacco: Never  Vaping Use   Vaping status: Never Used  Substance and Sexual Activity   Alcohol  use: Yes    Comment: rare   Drug use: No   Sexual activity: Yes  Other Topics Concern   Not on file  Social History Narrative   Pt is right handed   Lives in single story home with his wife, Chris [name]   Has 2 adult children   Bachelors degree in Chartered loss adjuster   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 Does not know date/month. No aphasia or dysarthria. Fund of knowledge is appropriate.  Recent and remote memory are impaired, 0/3 delayed recall.  Attention and concentration are reduced, 1/5 WORLD backwards (DRLD).  Cranial nerves: Pupils equal, round. Extraocular movements intact with no nystagmus. Visual fields full.  No facial asymmetry.  Motor: Bulk and tone normal, muscle strength 5/5 throughout with no pronator  drift.   Finger to nose testing intact.  Gait small strides, no ataxia. No tremors.    IMPRESSION: Camron GORMAN Woodie Raddle. is a very pleasant 78 y.o. RH male with a history of diet-controlled hypertension, depression, anxiety and a diagnosis of dementia likely multifactorial,  vascular and Alzheimer's disease and chronic daily headaches. MMSE 21/30 in 03/2023, continue Donepezil  10mg  daily and Memantine  10mg  BID. Headaches appear better, we discussed minimizing medications that can affect cognition as much as possible, they will try reducing Gabapentin  to 100mg  BID and monitor headaches, if they worsen, go back to 100mg  in AM, 200mg  in PM. We again discussed how hearing loss can affect cognition, she will consider another pair of hearing aids. Continue close supervision. Follow-up with Memory Disorders PA Camie Sevin in 6 months, call for any changes.    Thank you for allowing me to participate in his care.  Please do not hesitate to call for any questions or concerns.    Darice Shivers, M.D.   CC: Rocky Don, PA-C

## 2023-11-26 DIAGNOSIS — Z6827 Body mass index (BMI) 27.0-27.9, adult: Secondary | ICD-10-CM | POA: Diagnosis not present

## 2023-11-26 DIAGNOSIS — B370 Candidal stomatitis: Secondary | ICD-10-CM | POA: Diagnosis not present

## 2023-12-26 DIAGNOSIS — R739 Hyperglycemia, unspecified: Secondary | ICD-10-CM | POA: Diagnosis not present

## 2023-12-26 DIAGNOSIS — E7801 Familial hypercholesterolemia: Secondary | ICD-10-CM | POA: Diagnosis not present

## 2023-12-26 DIAGNOSIS — Z1329 Encounter for screening for other suspected endocrine disorder: Secondary | ICD-10-CM | POA: Diagnosis not present

## 2023-12-26 DIAGNOSIS — Z13220 Encounter for screening for lipoid disorders: Secondary | ICD-10-CM | POA: Diagnosis not present

## 2023-12-26 DIAGNOSIS — Z0001 Encounter for general adult medical examination with abnormal findings: Secondary | ICD-10-CM | POA: Diagnosis not present

## 2023-12-26 DIAGNOSIS — Z0000 Encounter for general adult medical examination without abnormal findings: Secondary | ICD-10-CM | POA: Diagnosis not present

## 2023-12-26 DIAGNOSIS — R42 Dizziness and giddiness: Secondary | ICD-10-CM | POA: Diagnosis not present

## 2023-12-26 DIAGNOSIS — Z1331 Encounter for screening for depression: Secondary | ICD-10-CM | POA: Diagnosis not present

## 2023-12-26 DIAGNOSIS — I10 Essential (primary) hypertension: Secondary | ICD-10-CM | POA: Diagnosis not present

## 2023-12-26 DIAGNOSIS — Z130 Encounter for screening for diseases of the blood and blood-forming organs and certain disorders involving the immune mechanism: Secondary | ICD-10-CM | POA: Diagnosis not present

## 2024-01-01 DIAGNOSIS — I10 Essential (primary) hypertension: Secondary | ICD-10-CM | POA: Diagnosis not present

## 2024-01-01 DIAGNOSIS — Z6828 Body mass index (BMI) 28.0-28.9, adult: Secondary | ICD-10-CM | POA: Diagnosis not present

## 2024-01-01 DIAGNOSIS — Z0000 Encounter for general adult medical examination without abnormal findings: Secondary | ICD-10-CM | POA: Diagnosis not present

## 2024-01-01 DIAGNOSIS — J300 Vasomotor rhinitis: Secondary | ICD-10-CM | POA: Diagnosis not present

## 2024-01-01 DIAGNOSIS — Z23 Encounter for immunization: Secondary | ICD-10-CM | POA: Diagnosis not present

## 2024-01-01 DIAGNOSIS — F411 Generalized anxiety disorder: Secondary | ICD-10-CM | POA: Diagnosis not present

## 2024-01-11 ENCOUNTER — Other Ambulatory Visit: Payer: Self-pay | Admitting: Urology

## 2024-01-11 DIAGNOSIS — N138 Other obstructive and reflux uropathy: Secondary | ICD-10-CM

## 2024-02-25 DIAGNOSIS — H43812 Vitreous degeneration, left eye: Secondary | ICD-10-CM | POA: Diagnosis not present

## 2024-03-05 ENCOUNTER — Encounter: Payer: Self-pay | Admitting: Neurology

## 2024-03-27 ENCOUNTER — Other Ambulatory Visit

## 2024-03-27 DIAGNOSIS — R972 Elevated prostate specific antigen [PSA]: Secondary | ICD-10-CM

## 2024-03-28 LAB — PSA, TOTAL AND FREE
PSA, Free Pct: 23.6 %
PSA, Free: 1.32 ng/mL
Prostate Specific Ag, Serum: 5.6 ng/mL — ABNORMAL HIGH (ref 0.0–4.0)

## 2024-04-02 ENCOUNTER — Ambulatory Visit: Admitting: Urology

## 2024-04-02 ENCOUNTER — Encounter: Payer: Self-pay | Admitting: Urology

## 2024-04-02 VITALS — BP 122/77 | HR 77

## 2024-04-02 DIAGNOSIS — N401 Enlarged prostate with lower urinary tract symptoms: Secondary | ICD-10-CM | POA: Diagnosis not present

## 2024-04-02 DIAGNOSIS — R351 Nocturia: Secondary | ICD-10-CM | POA: Diagnosis not present

## 2024-04-02 DIAGNOSIS — R972 Elevated prostate specific antigen [PSA]: Secondary | ICD-10-CM | POA: Diagnosis not present

## 2024-04-02 DIAGNOSIS — N138 Other obstructive and reflux uropathy: Secondary | ICD-10-CM

## 2024-04-02 LAB — URINALYSIS, ROUTINE W REFLEX MICROSCOPIC
Bilirubin, UA: NEGATIVE
Glucose, UA: NEGATIVE
Ketones, UA: NEGATIVE
Leukocytes,UA: NEGATIVE
Nitrite, UA: NEGATIVE
Protein,UA: NEGATIVE
Specific Gravity, UA: 1.02 (ref 1.005–1.030)
Urobilinogen, Ur: 0.2 mg/dL (ref 0.2–1.0)
pH, UA: 6 (ref 5.0–7.5)

## 2024-04-02 LAB — MICROSCOPIC EXAMINATION
Bacteria, UA: NONE SEEN
WBC, UA: NONE SEEN /HPF (ref 0–5)

## 2024-04-02 MED ORDER — SILODOSIN 8 MG PO CAPS: 8.0000 mg | ORAL_CAPSULE | Freq: Every day | ORAL | 3 refills | Status: AC

## 2024-04-02 NOTE — Addendum Note (Signed)
 Addended by: MALACHY SLICE on: 04/02/2024 01:17 PM   Modules accepted: Orders

## 2024-04-02 NOTE — Progress Notes (Signed)
 04/02/2024 12:04 PM   Chris [name]. 1946/01/03 [id number]  Referring provider: Dow Longs, PA-C [address] [place],  [place] [address]  No chief complaint on file.   HPI: Chris [name] is a 78yo here for followup for elevated PSA and BPH with nocturia. PSA decreased to 5.6 from 6.8. IPSS 3 QOL 2 on rapaflo  8mg  daily. Nocturia 2x. Urine stream strong. No straining to urinate.    PMH: Past Medical History:  Diagnosis Date   Anxiety    Chest pain, unspecified    Depression    Dyslexia    Hearing loss    Attributed to recurring sinus infections   Seasonal allergies    Stroke (HCC)    Right lateral temporal lobe   Unspecified essential hypertension     Surgical History: Past Surgical History:  Procedure Laterality Date   HIP ARTHROPLASTY     TOTAL KNEE ARTHROPLASTY      Home Medications:  Allergies as of 04/02/2024   No Known Allergies      Medication List        Accurate as of April 02, 2024 12:04 PM. If you have any questions, ask your nurse or doctor.          acetaminophen 325 MG tablet Commonly known as: TYLENOL Take 650 mg by mouth every 6 (six) hours as needed.   buPROPion 150 MG 24 hr tablet Commonly known as: WELLBUTRIN XL   busPIRone 15 MG tablet Commonly known as: BUSPAR Take 15 mg by mouth 3 (three) times daily.   desvenlafaxine 100 MG 24 hr tablet Commonly known as: PRISTIQ   donepezil  10 MG tablet Commonly known as: ARICEPT  Take 1 tablet (10 mg total) by mouth daily.   Fish Oil 1000 MG Caps Take by mouth.   fluticasone 50 MCG/ACT nasal spray Commonly known as: FLONASE   gabapentin  100 MG capsule Commonly known as: Neurontin  Take 1 capsule in AM, 2 capsules in PM   ibuprofen 200 MG tablet Commonly known as: ADVIL Take 200 mg by mouth every 6 (six) hours as needed.   levofloxacin  750 MG tablet Commonly known as: Levaquin  Take 1 tablet (750 mg total) by mouth daily. Take 1 hour prior to your prostate biopsy procedure  09/12/23   losartan 25 MG tablet Commonly known as: COZAAR Take 25 mg by mouth daily.   Meclizine HCl 25 MG Chew Chew by mouth.   Melatonin 10 MG Caps Take 20 mg by mouth at bedtime.   memantine  10 MG tablet Commonly known as: NAMENDA  Take 1 tablet (10 mg total) by mouth 2 (two) times daily.   montelukast 10 MG tablet Commonly known as: SINGULAIR   Mucinex Sinus-Max 5-10-200-325 MG Tabs Generic drug: Phenylephrine-DM-GG-APAP Take by mouth as needed.   Multivital tablet Take 1 tablet by mouth daily.   silodosin  8 MG Caps capsule Commonly known as: RAPAFLO  TAKE ONE CAPSULE BY MOUTH DAILY WITH BREAKFAST   vitamin B-12 100 MCG tablet Commonly known as: CYANOCOBALAMIN  Take 100 mcg by mouth daily.        Allergies: Allergies[1]  Family History: Family History  Problem Relation Age of Onset   Heart disease Mother    Cancer Father     Social History:  reports that he quit smoking about 54 years ago. He started smoking about 56 years ago. He has a 1 pack-year smoking history. He has never used smokeless tobacco. He reports current alcohol  use. He reports that he does not use drugs.  ROS: All other review of systems were reviewed and are negative except what is noted above in HPI  Physical Exam: BP 122/77   Pulse 77   Constitutional:  Alert and oriented, No acute distress. HEENT: [place] AT, moist mucus membranes.  Trachea midline, no masses. Cardiovascular: No clubbing, cyanosis, or edema. Respiratory: Normal respiratory effort, no increased work of breathing. GI: Abdomen is soft, nontender, nondistended, no abdominal masses GU: No CVA tenderness.  Lymph: No cervical or inguinal lymphadenopathy. Skin: No rashes, bruises or suspicious lesions. Neurologic: Grossly intact, no focal deficits, moving all 4 extremities. Psychiatric: Normal mood and affect.  Laboratory Data: Lab Results  Component Value Date   WBC 5.7 01/18/2008   HGB 13.8 01/18/2008   HCT 39.8  01/18/2008   MCV 92.6 01/18/2008   PLT 159 01/18/2008    Lab Results  Component Value Date   CREATININE 0.88 01/18/2008    No results found for: PSA  No results found for: TESTOSTERONE  No results found for: HGBA1C  Urinalysis    Component Value Date/Time   APPEARANCEUR Clear 07/09/2023 1007   GLUCOSEU Negative 07/09/2023 1007   BILIRUBINUR Negative 07/09/2023 1007   PROTEINUR Negative 07/09/2023 1007   UROBILINOGEN 0.2 08/04/2019 1340   NITRITE Negative 07/09/2023 1007   LEUKOCYTESUR Negative 07/09/2023 1007    Lab Results  Component Value Date   LABMICR See below: 07/09/2023   WBCUA None seen 07/09/2023   LABEPIT None seen 07/09/2023   MUCUS Present (A) 07/09/2023   BACTERIA None seen 07/09/2023    Pertinent Imaging:  No results found for this or any previous visit.  No results found for this or any previous visit.  No results found for this or any previous visit.  No results found for this or any previous visit.  Results for orders placed during the hospital encounter of 01/04/21  Ultrasound renal complete  Narrative CLINICAL DATA:  Nephrolithiasis.  EXAM: RENAL / URINARY TRACT ULTRASOUND COMPLETE  COMPARISON:  January 22, 2020.  FINDINGS: Right Kidney:  Renal measurements: 9.0 x 5.6 x 4.4 cm = volume: 115 mL. Echogenicity within normal limits. No mass or hydronephrosis visualized.  Left Kidney:  Renal measurements: 11.0 x 5.6 x 4.9 cm = volume: 158 mL. Echogenicity within normal limits. No mass or hydronephrosis visualized.  Bladder:  Not well distended and therefore not well visualized. Patient reportedly voided prior to exam.  Other:  None.  IMPRESSION: No renal abnormality is noted.   Electronically Signed By: Chris [name] M.D. On: 01/05/2021 09:59  No results found for this or any previous visit.  No results found for this or any previous visit.  No results found for this or any previous  visit.   Assessment & Plan:    1. Elevated PSA (Primary) Followup 6 monhts with a PSA  2. Benign prostatic hyperplasia with urinary obstruction -continue rapaflo  8mg    3. Nocturia Continue rapaflo  8mg  daily.   No follow-ups on file.  Chris Clara, MD  [facility] [place]      [1] No Known Allergies

## 2024-04-02 NOTE — Patient Instructions (Signed)

## 2024-05-12 NOTE — Progress Notes (Incomplete)
 "   Dementia likely due to    Chris [name]. is a very pleasant 79 y.o. RH male with a history of diet-controlled hypertension, depression, anxiety and a diagnosis of dementia likely multifactorial, chronic headaches, and a diagnosis of vascular and Alzheimer's disease seen today in follow up for memory loss. Patient is currently on memantine  10 mg twice daily and donepezil  10 mg daily, tolerating well.***.   Patient was last seen on 11/12/2023.***. Memory is ***. MMSE today is  /30. Patient is able to participate on ADLs and continues to drive without difficulties. Mood is*** . This patient is accompanied in the office by his wife*** who supplements the history.  Previous records as well as any outside records available were reviewed prior to todays visit.   Follow up in  months Recommend good control of cardiovascular risk factors.   Continue to control mood as per PCP Continue donepezil  10 mg daily and memantine  10 mg twice daily, side effects discussed. For headaches, continue gabapentin  100 mg twice daily, monitor for headaches and if they worsen go back to 100 mg in a.m. and 200 mg p.m., side effects discussed Recommend checking hearing in an effort to improve comprehension.  Recommend using the hearing aids.   Discussed the use of AI scribe software for clinical note transcription with the patient, who gave verbal consent to proceed.  History of Present Illness   He feels his memory is pretty good. His wife notes worsening short-term memory, he asks the same questions 3-5 times. She manages medications, finances, meals. He does not drive. He is independent with dressing and bathing. Mood is pretty good, his wife notes he is a little more agitated later in the day, talking a lot but not making sense. No paranoia or hallucinations. He sleeps fairly well unless he has to get up to go to the bathroom (nocturia).  No bowel dysfunction.   He has chronic daily headaches with good response to  Gabapentin  100mg  in AM, 200mg  in PM. He reports headaches are on and off, his wife notes there is a headache daily but he does not complain a lot about them and does not take any prn medication. His dizziness has gotten worse, his wife reports this is a chronic issue for years, he does not do well walking more than 1-2 miles, he would get dizzy/lightheaded and have to sit down. He states the dizziness does not last long, he does not complain of dizziness once sitting. 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 This is the 5th pair. No falls. He is sleeping well.        History on Initial Assessment 05/30/2018: This is a 79 year old right-handed man with a history of diet-controlled hypertension, depression, anxiety, presenting for evaluation of worsening memory. He states his memory is good and bad at certain things, she cannot recall what he did yesterday, he would not recall the date and have to look at his calendar. He denies getting lost driving. His wife occasionally reminds him to take his medications. He has frequent headaches and wants to take over the counter medication all day long. His wife manages bills. His wife states he has never been one to remember names, but in the past 6 months, she has noticed he would have difficulty thinking of the name of an object and get dates and times confused. He would ask her 5 times in one day what time his doctor  appointment was. She denies any significant driving concerns. She has noticed that he gets really nervous and shaky around 2pm, then calms down by 8pm. Sleep is good, no wandering behavior. He still feels drowsy during the day. No paranoia or hallucinations. They report he had failed several grades in school, joined the [employer], then went to [employer] where he had a GPA of 3.85. It was in college where he was formally diagnosed with dyslexia. 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 There is no family  history of dementia. No history of significant head injuries. He drinks 1 big can of beer a week, sometimes he drinks 3-4 times a week.    He has had daily headaches for several years and saw neurologist Dr. Oneita in 2018. He had trigger point injections and became headache-free for a while, until he had a fall with loss of consciousness last September 2019 and headaches recurred. He has a moderate ache in the frontal region constantly, no nausea/vomiting, photo/phonophobia. He has been taking Tylenol and BC powders but states he does not take it quite everyday. He has dizziness with lightheadedness sometimes lasting all day until he takes meclizine which helps. He denies any diplopia, dysarthria/dysphagia, back pain, bladder dysfunction, anosmia. He has occasional neck pain and constipation. He started noticing bilateral hand tremors 2-3 years ago which do not affect writing or using utensils. He fell while walking on a trail last September, he recalls starting feeling bad in his stomach and started walking faster, then passed out. He feels he was only out for a second, he does not remember hitting the ground but stood up and realized he had fallen down. He had significant injury on the left eyelid and left side of his face and underwent plastic surgery. He still has stitches on the left lateral eyelid. He has occasional numbness and tingling in his right hand, it feels cold sometimes. He has noticed difficulty buttoning or tying shoelaces with his right hand. His wife has noticed shuffling when walking over the past year.   I personally reviewed MRI brain without contrast done 11/27/2016 which did not show any acute changes. There was scattered foci of susceptibility are present over the posterior left temporal lobe and occipital lobe, increased dural vasculature is suggested on axial image 8 of series 7. 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 There was moderate diffuse atrophy and mild  chronic microvascular disease   I personally reviewed MRI brain with and without contrast done February 2020 which did not show any acute changes.There was stable small chronic infarct within the right lateral temporal lobe, stable mild chronic microvascular disease, moderate diffuse volume loss with prominent volume loss in the anteromedial temporal lobes and parahippocampal gyri (which can be seen with Alzheimer's or frontotemporal lobar degeneration). There were numerous foci of chronic microhemorrhages predominantly in the posterior distribution, greater on the left (favoring amyloid angiopathy).    Neuropsychological testing in 02/2019 showed Major Neurocognitive Disorder (dementia), mild end of spectrum. Etiology likely multifactorial, vascular and Alzheimer's disease.            04/16/2023   12:00 PM 03/23/2022   11:00 AM 09/19/2021    8:00 AM  MMSE - Mini Mental State Exam  Orientation to time 2 4 4   Orientation to Place 4 5 5   Registration 3 3 3   Attention/ Calculation 4 4 5   Recall 0 1 0  Language- name 2 objects 2 2 2   Language- repeat  1 1 1   Language- follow 3 step command 3 3 3   Language- read & follow direction 1 1 1   Write a sentence 0 0 1  Copy design 1 1 1   Total score 21 25 26       12/16/2018   10:00 AM 05/30/2018    9:00 AM  Montreal Cognitive Assessment   Visuospatial/ Executive (0/5) 3 5  Naming (0/3) 2 1  Attention: Read list of digits (0/2) 2 1  Attention: Read list of letters (0/1) 1 1  Attention: Serial 7 subtraction starting at 100 (0/3) 1 2  Language: Repeat phrase (0/2) 0 0  Language : Fluency (0/1) 0 1  Abstraction (0/2) 0 0  Delayed Recall (0/5) 0 0  Orientation (0/6) 2 4  Total 11 15      Objective:    Neurological Exam:    VITALS:  There were no vitals filed for this visit.  GEN:  The patient appears stated age and is in NAD. HEENT:  Normocephalic, atraumatic.   Neurological examination:  General: NAD, well-groomed, appears stated  age. Orientation: The patient is alert. Oriented to person, place and not to date Cranial nerves: There is good facial symmetry.The speech is fluent and clear. No aphasia or dysarthria. Fund of knowledge is appropriate. Recent and remote memory are impaired. Attention and concentration are reduced. Able to name objects and repeat phrases.  Hearing is intact to conversational tone. *** Sensation: Sensation is intact to light touch throughout Motor: Strength is at least antigravity x4. DTR's 2/4 in UE/LE     Movement examination:  Tone: There is normal tone in the UE/LE Abnormal movements:  no tremor.  No myoclonus.  No asterixis.   Coordination:  There is no decremation with RAM's. Normal finger to nose  Gait and Station: The patient has no*** difficulty arising out of a deep-seated chair without the use of the hands. The patient's stride length is good.  Gait is cautious and narrow.    Thank you for allowing us  the opportunity to participate in the care of this nice patient. Please do not hesitate to contact us  for any questions or concerns.   Total time spent on today's visit was *** minutes dedicated to this patient today, preparing to see patient, examining the patient, ordering tests and/or medications and counseling the patient, documenting clinical information in the EHR or other health record, independently interpreting results and communicating results to the patient/family, discussing treatment and goals, answering patient's questions and coordinating care.  Cc:  Dow Longs, PA-C  Camie Sevin 05/12/2024 11:48 AM      "

## 2024-05-14 ENCOUNTER — Ambulatory Visit: Admitting: Physician Assistant

## 2024-06-17 ENCOUNTER — Ambulatory Visit: Admitting: Physician Assistant

## 2024-10-02 ENCOUNTER — Other Ambulatory Visit

## 2024-10-08 ENCOUNTER — Ambulatory Visit: Admitting: Urology

## 2024-11-11 ENCOUNTER — Ambulatory Visit: Admitting: Neurology
